# Patient Record
Sex: Female | Born: 1965 | Race: Black or African American | Hispanic: No | Marital: Married | Smoking: Never smoker
Health system: Southern US, Community
[De-identification: ages and names within clinical notes are randomized; demographics above are authoritative.]

## PROBLEM LIST (undated history)

## (undated) DIAGNOSIS — E785 Hyperlipidemia, unspecified: Secondary | ICD-10-CM

## (undated) HISTORY — DX: Hyperlipidemia, unspecified: E78.5

## (undated) HISTORY — PX: ABDOMINAL HYSTERECTOMY: SHX81

---

## 2001-04-23 ENCOUNTER — Encounter: Admission: RE | Admit: 2001-04-23 | Discharge: 2001-04-23 | Payer: Self-pay | Admitting: Orthopedic Surgery

## 2001-04-23 ENCOUNTER — Encounter: Payer: Self-pay | Admitting: Orthopedic Surgery

## 2001-08-26 ENCOUNTER — Encounter: Payer: Self-pay | Admitting: Orthopedic Surgery

## 2001-08-29 ENCOUNTER — Encounter: Payer: Self-pay | Admitting: Orthopedic Surgery

## 2001-08-29 ENCOUNTER — Inpatient Hospital Stay (HOSPITAL_COMMUNITY): Admission: RE | Admit: 2001-08-29 | Discharge: 2001-09-03 | Payer: Self-pay | Admitting: Orthopedic Surgery

## 2001-09-01 ENCOUNTER — Encounter: Payer: Self-pay | Admitting: Orthopedic Surgery

## 2001-09-02 ENCOUNTER — Encounter: Payer: Self-pay | Admitting: Orthopedic Surgery

## 2001-09-03 ENCOUNTER — Encounter: Payer: Self-pay | Admitting: Orthopedic Surgery

## 2002-05-05 ENCOUNTER — Encounter: Admission: RE | Admit: 2002-05-05 | Discharge: 2002-05-05 | Payer: Self-pay | Admitting: Orthopedic Surgery

## 2002-05-05 ENCOUNTER — Encounter: Payer: Self-pay | Admitting: Orthopedic Surgery

## 2002-11-11 ENCOUNTER — Encounter: Admission: RE | Admit: 2002-11-11 | Discharge: 2002-11-11 | Payer: Self-pay | Admitting: Orthopedic Surgery

## 2002-11-11 ENCOUNTER — Encounter: Payer: Self-pay | Admitting: Orthopedic Surgery

## 2003-05-05 ENCOUNTER — Encounter: Admission: RE | Admit: 2003-05-05 | Discharge: 2003-05-05 | Payer: Self-pay | Admitting: Orthopedic Surgery

## 2003-12-06 ENCOUNTER — Encounter: Admission: RE | Admit: 2003-12-06 | Discharge: 2003-12-06 | Payer: Self-pay | Admitting: Orthopedic Surgery

## 2005-05-17 ENCOUNTER — Ambulatory Visit: Payer: Self-pay | Admitting: Physical Medicine & Rehabilitation

## 2005-05-17 ENCOUNTER — Inpatient Hospital Stay (HOSPITAL_COMMUNITY): Admission: RE | Admit: 2005-05-17 | Discharge: 2005-05-22 | Payer: Self-pay | Admitting: Orthopedic Surgery

## 2005-05-29 ENCOUNTER — Encounter: Admission: RE | Admit: 2005-05-29 | Discharge: 2005-05-29 | Payer: Self-pay | Admitting: Orthopedic Surgery

## 2005-12-18 ENCOUNTER — Ambulatory Visit: Payer: Self-pay | Admitting: Family Medicine

## 2005-12-25 ENCOUNTER — Ambulatory Visit: Payer: Self-pay | Admitting: Gastroenterology

## 2007-11-07 ENCOUNTER — Ambulatory Visit: Payer: Self-pay

## 2008-07-07 ENCOUNTER — Ambulatory Visit: Payer: Self-pay | Admitting: Internal Medicine

## 2010-02-21 ENCOUNTER — Ambulatory Visit: Payer: Self-pay | Admitting: Family Medicine

## 2010-03-26 ENCOUNTER — Encounter: Payer: Self-pay | Admitting: Orthopedic Surgery

## 2011-04-24 ENCOUNTER — Ambulatory Visit: Payer: Self-pay

## 2012-05-02 ENCOUNTER — Ambulatory Visit: Payer: Self-pay

## 2013-07-28 ENCOUNTER — Ambulatory Visit: Payer: Self-pay

## 2014-12-30 DIAGNOSIS — Z96643 Presence of artificial hip joint, bilateral: Secondary | ICD-10-CM

## 2014-12-30 DIAGNOSIS — M1711 Unilateral primary osteoarthritis, right knee: Secondary | ICD-10-CM

## 2014-12-30 HISTORY — DX: Unilateral primary osteoarthritis, right knee: M17.11

## 2014-12-30 HISTORY — DX: Presence of artificial hip joint, bilateral: Z96.643

## 2014-12-31 ENCOUNTER — Other Ambulatory Visit: Payer: Self-pay | Admitting: Family Medicine

## 2014-12-31 DIAGNOSIS — Z1231 Encounter for screening mammogram for malignant neoplasm of breast: Secondary | ICD-10-CM

## 2015-01-06 ENCOUNTER — Ambulatory Visit
Admission: RE | Admit: 2015-01-06 | Discharge: 2015-01-06 | Disposition: A | Discharge status: Home / Self Care | Payer: BLUE CROSS/BLUE SHIELD | Source: Ambulatory Visit | Attending: Family Medicine | Admitting: Family Medicine

## 2015-01-06 DIAGNOSIS — Z1231 Encounter for screening mammogram for malignant neoplasm of breast: Secondary | ICD-10-CM | POA: Insufficient documentation

## 2016-02-02 ENCOUNTER — Other Ambulatory Visit: Payer: Self-pay | Admitting: Family Medicine

## 2016-02-02 DIAGNOSIS — Z1231 Encounter for screening mammogram for malignant neoplasm of breast: Secondary | ICD-10-CM

## 2016-02-21 ENCOUNTER — Ambulatory Visit: Admission: RE | Admit: 2016-02-21 | Payer: BLUE CROSS/BLUE SHIELD | Source: Ambulatory Visit

## 2016-03-30 ENCOUNTER — Ambulatory Visit: Payer: BLUE CROSS/BLUE SHIELD

## 2016-04-23 ENCOUNTER — Ambulatory Visit
Admission: RE | Admit: 2016-04-23 | Discharge: 2016-04-23 | Disposition: A | Discharge status: Home / Self Care | Payer: BLUE CROSS/BLUE SHIELD | Source: Ambulatory Visit | Attending: Family Medicine | Admitting: Family Medicine

## 2016-04-23 DIAGNOSIS — Z1231 Encounter for screening mammogram for malignant neoplasm of breast: Secondary | ICD-10-CM

## 2017-04-12 ENCOUNTER — Other Ambulatory Visit: Payer: Self-pay | Admitting: Family Medicine

## 2017-04-12 DIAGNOSIS — Z1231 Encounter for screening mammogram for malignant neoplasm of breast: Secondary | ICD-10-CM

## 2017-04-30 ENCOUNTER — Ambulatory Visit
Admission: RE | Admit: 2017-04-30 | Discharge: 2017-04-30 | Disposition: A | Discharge status: Home / Self Care | Payer: Managed Care, Other (non HMO) | Source: Ambulatory Visit | Attending: Family Medicine | Admitting: Family Medicine

## 2017-04-30 DIAGNOSIS — Z1231 Encounter for screening mammogram for malignant neoplasm of breast: Secondary | ICD-10-CM

## 2017-07-02 ENCOUNTER — Encounter (INDEPENDENT_AMBULATORY_CARE_PROVIDER_SITE_OTHER): Payer: Self-pay

## 2017-07-02 ENCOUNTER — Other Ambulatory Visit: Payer: Self-pay | Admitting: Family Medicine

## 2017-07-02 ENCOUNTER — Ambulatory Visit
Admission: RE | Admit: 2017-07-02 | Discharge: 2017-07-02 | Disposition: A | Discharge status: Home / Self Care | Payer: Managed Care, Other (non HMO) | Source: Ambulatory Visit | Attending: Family Medicine | Admitting: Family Medicine

## 2017-07-02 DIAGNOSIS — R600 Localized edema: Secondary | ICD-10-CM | POA: Diagnosis not present

## 2017-07-02 DIAGNOSIS — M79605 Pain in left leg: Principal | ICD-10-CM

## 2017-07-02 DIAGNOSIS — M79604 Pain in right leg: Secondary | ICD-10-CM

## 2018-04-28 ENCOUNTER — Other Ambulatory Visit: Payer: Self-pay | Admitting: Family Medicine

## 2018-04-28 DIAGNOSIS — Z780 Asymptomatic menopausal state: Secondary | ICD-10-CM

## 2018-05-01 ENCOUNTER — Other Ambulatory Visit: Payer: Self-pay | Admitting: Family Medicine

## 2018-05-01 DIAGNOSIS — Z1231 Encounter for screening mammogram for malignant neoplasm of breast: Secondary | ICD-10-CM

## 2018-05-05 ENCOUNTER — Ambulatory Visit: Payer: Managed Care, Other (non HMO)

## 2018-05-15 ENCOUNTER — Ambulatory Visit: Payer: Managed Care, Other (non HMO)

## 2018-05-21 ENCOUNTER — Encounter (INDEPENDENT_AMBULATORY_CARE_PROVIDER_SITE_OTHER): Payer: Self-pay

## 2018-05-21 ENCOUNTER — Ambulatory Visit
Admission: RE | Admit: 2018-05-21 | Discharge: 2018-05-21 | Disposition: A | Discharge status: Home / Self Care | Payer: Managed Care, Other (non HMO) | Source: Ambulatory Visit | Attending: Family Medicine | Admitting: Family Medicine

## 2018-05-21 ENCOUNTER — Other Ambulatory Visit: Payer: Self-pay

## 2018-05-21 DIAGNOSIS — Z1231 Encounter for screening mammogram for malignant neoplasm of breast: Secondary | ICD-10-CM | POA: Insufficient documentation

## 2019-06-04 ENCOUNTER — Other Ambulatory Visit: Payer: Self-pay | Admitting: Family Medicine

## 2019-06-04 DIAGNOSIS — Z1231 Encounter for screening mammogram for malignant neoplasm of breast: Secondary | ICD-10-CM

## 2019-06-08 ENCOUNTER — Ambulatory Visit
Admission: RE | Admit: 2019-06-08 | Discharge: 2019-06-08 | Disposition: A | Discharge status: Home / Self Care | Payer: Managed Care, Other (non HMO) | Source: Ambulatory Visit | Attending: Family Medicine | Admitting: Family Medicine

## 2019-06-08 DIAGNOSIS — Z1231 Encounter for screening mammogram for malignant neoplasm of breast: Secondary | ICD-10-CM | POA: Insufficient documentation

## 2019-12-09 ENCOUNTER — Telehealth: Payer: Self-pay | Admitting: Licensed Clinical Social Worker

## 2019-12-09 NOTE — Telephone Encounter (Signed)
Patient left vm for LCSW regarding interest in mental health/behavior health services.

## 2020-05-11 ENCOUNTER — Other Ambulatory Visit: Payer: Self-pay | Admitting: Family Medicine

## 2020-05-11 DIAGNOSIS — Z1231 Encounter for screening mammogram for malignant neoplasm of breast: Secondary | ICD-10-CM

## 2020-06-04 DIAGNOSIS — G8929 Other chronic pain: Secondary | ICD-10-CM | POA: Insufficient documentation

## 2020-06-04 HISTORY — DX: Other chronic pain: G89.29

## 2020-06-04 HISTORY — DX: Morbid (severe) obesity due to excess calories: E66.01

## 2020-06-08 ENCOUNTER — Other Ambulatory Visit: Payer: Self-pay

## 2020-06-08 ENCOUNTER — Ambulatory Visit
Admission: RE | Admit: 2020-06-08 | Discharge: 2020-06-08 | Disposition: A | Discharge status: Home / Self Care | Payer: 59 | Source: Ambulatory Visit | Attending: Family Medicine | Admitting: Family Medicine

## 2020-06-08 DIAGNOSIS — Z1231 Encounter for screening mammogram for malignant neoplasm of breast: Secondary | ICD-10-CM

## 2021-05-01 ENCOUNTER — Other Ambulatory Visit: Payer: Self-pay | Admitting: Family Medicine

## 2021-05-01 DIAGNOSIS — Z1231 Encounter for screening mammogram for malignant neoplasm of breast: Secondary | ICD-10-CM

## 2021-06-23 LAB — TSH: TSH: 2.92 (ref 0.41–5.90)

## 2021-07-19 ENCOUNTER — Ambulatory Visit
Admission: RE | Admit: 2021-07-19 | Discharge: 2021-07-19 | Disposition: A | Discharge status: Home / Self Care | Payer: 59 | Source: Ambulatory Visit | Attending: Family Medicine | Admitting: Family Medicine

## 2021-07-19 DIAGNOSIS — Z1231 Encounter for screening mammogram for malignant neoplasm of breast: Secondary | ICD-10-CM | POA: Insufficient documentation

## 2021-09-22 LAB — VITAMIN D 25 HYDROXY (VIT D DEFICIENCY, FRACTURES): Vit D, 25-Hydroxy: 34.9

## 2021-09-22 LAB — VITAMIN B12: Vitamin B-12: 774

## 2022-01-05 LAB — BASIC METABOLIC PANEL
BUN: 17 (ref 4–21)
CO2: 22 (ref 13–22)
Chloride: 106 (ref 99–108)
Creatinine: 0.9 (ref 0.5–1.1)
Glucose: 88
Potassium: 4.1 mEq/L (ref 3.5–5.1)
Sodium: 141 (ref 137–147)

## 2022-01-05 LAB — CBC AND DIFFERENTIAL
HCT: 41 (ref 36–46)
Hemoglobin: 13.6 (ref 12.0–16.0)
Neutrophils Absolute: 4.7
Platelets: 175 10*3/uL (ref 150–400)
WBC: 6.9

## 2022-01-05 LAB — HEPATIC FUNCTION PANEL
ALT: 9 U/L (ref 7–35)
AST: 20 (ref 13–35)
Alkaline Phosphatase: 70 (ref 25–125)
Bilirubin, Direct: 0.1

## 2022-01-05 LAB — COMPREHENSIVE METABOLIC PANEL
Albumin: 4 (ref 3.5–5.0)
Calcium: 9 (ref 8.7–10.7)
Globulin: 2.2
eGFR: 72

## 2022-01-05 LAB — LIPID PANEL
Cholesterol: 151 (ref 0–200)
HDL: 64 (ref 35–70)
LDL Cholesterol: 77
Triglycerides: 48 (ref 40–160)

## 2022-01-05 LAB — HEMOGLOBIN A1C: Hemoglobin A1C: 5.4

## 2022-01-05 LAB — CBC: RBC: 4.54 (ref 3.87–5.11)

## 2022-04-11 ENCOUNTER — Encounter: Payer: Self-pay | Admitting: Family

## 2022-04-11 DIAGNOSIS — I10 Essential (primary) hypertension: Secondary | ICD-10-CM

## 2022-04-11 DIAGNOSIS — E559 Vitamin D deficiency, unspecified: Secondary | ICD-10-CM | POA: Insufficient documentation

## 2022-04-11 HISTORY — DX: Vitamin D deficiency, unspecified: E55.9

## 2022-04-11 HISTORY — DX: Essential (primary) hypertension: I10

## 2022-04-12 ENCOUNTER — Encounter: Payer: Self-pay | Admitting: Family

## 2022-04-12 ENCOUNTER — Ambulatory Visit (INDEPENDENT_AMBULATORY_CARE_PROVIDER_SITE_OTHER): Payer: 59 | Admitting: Family

## 2022-04-12 VITALS — BP 112/80 | HR 88 | Ht 66.0 in | Wt 261.2 lb

## 2022-04-12 DIAGNOSIS — Z6841 Body Mass Index (BMI) 40.0 and over, adult: Secondary | ICD-10-CM | POA: Diagnosis not present

## 2022-04-12 DIAGNOSIS — E782 Mixed hyperlipidemia: Secondary | ICD-10-CM | POA: Diagnosis not present

## 2022-04-12 DIAGNOSIS — R7303 Prediabetes: Secondary | ICD-10-CM | POA: Diagnosis not present

## 2022-04-12 DIAGNOSIS — M25579 Pain in unspecified ankle and joints of unspecified foot: Secondary | ICD-10-CM

## 2022-04-12 DIAGNOSIS — M1712 Unilateral primary osteoarthritis, left knee: Secondary | ICD-10-CM

## 2022-04-12 DIAGNOSIS — M1711 Unilateral primary osteoarthritis, right knee: Secondary | ICD-10-CM | POA: Diagnosis not present

## 2022-04-12 DIAGNOSIS — Z1159 Encounter for screening for other viral diseases: Secondary | ICD-10-CM

## 2022-04-12 DIAGNOSIS — E559 Vitamin D deficiency, unspecified: Secondary | ICD-10-CM | POA: Diagnosis not present

## 2022-04-12 DIAGNOSIS — I10 Essential (primary) hypertension: Secondary | ICD-10-CM

## 2022-04-12 DIAGNOSIS — Z113 Encounter for screening for infections with a predominantly sexual mode of transmission: Secondary | ICD-10-CM

## 2022-04-12 HISTORY — DX: Unilateral primary osteoarthritis, left knee: M17.12

## 2022-04-12 NOTE — Assessment & Plan Note (Signed)
Continue meloxicam.  Sending referral for weight loss.   
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
## 2022-04-12 NOTE — Patient Instructions (Addendum)

## 2022-04-12 NOTE — Assessment & Plan Note (Signed)
Sending Referral to Weight Management for her.  Will see if we can get a different weight loss medication covered by her insurance in the meantime.   Patient provided with low fat diet and calorie counting instructions.

## 2022-04-12 NOTE — Assessment & Plan Note (Signed)
Continue meloxicam.  Sending referral for weight loss.

## 2022-04-12 NOTE — Assessment & Plan Note (Signed)
Continue current meds - will send refills as needed.  Checking labs today.

## 2022-04-12 NOTE — Assessment & Plan Note (Signed)
Checking level today  Will let her know if she needs to continue the Vitamin D supplement.

## 2022-04-13 ENCOUNTER — Encounter: Payer: Self-pay | Admitting: Family

## 2022-04-13 LAB — CBC WITH DIFFERENTIAL
Basophils Absolute: 0 10*3/uL (ref 0.0–0.2)
Basos: 0 %
EOS (ABSOLUTE): 0.2 10*3/uL (ref 0.0–0.4)
Eos: 2 %
Hematocrit: 41.4 % (ref 34.0–46.6)
Hemoglobin: 13.8 g/dL (ref 11.1–15.9)
Immature Grans (Abs): 0 10*3/uL (ref 0.0–0.1)
Immature Granulocytes: 0 %
Lymphocytes Absolute: 1.7 10*3/uL (ref 0.7–3.1)
Lymphs: 27 %
MCH: 30.4 pg (ref 26.6–33.0)
MCHC: 33.3 g/dL (ref 31.5–35.7)
MCV: 91 fL (ref 79–97)
Monocytes Absolute: 0.4 10*3/uL (ref 0.1–0.9)
Monocytes: 6 %
Neutrophils Absolute: 4 10*3/uL (ref 1.4–7.0)
Neutrophils: 65 %
RBC: 4.54 x10E6/uL (ref 3.77–5.28)
RDW: 12.2 % (ref 11.7–15.4)
WBC: 6.2 10*3/uL (ref 3.4–10.8)

## 2022-04-13 LAB — CMP14+EGFR
ALT: 13 IU/L (ref 0–32)
AST: 16 IU/L (ref 0–40)
Albumin/Globulin Ratio: 1.8 (ref 1.2–2.2)
Albumin: 4 g/dL (ref 3.8–4.9)
Alkaline Phosphatase: 79 IU/L (ref 44–121)
BUN/Creatinine Ratio: 27 — ABNORMAL HIGH (ref 9–23)
BUN: 20 mg/dL (ref 6–24)
Bilirubin Total: <0.2 mg/dL (ref 0.0–1.2)
CO2: 22 mmol/L (ref 20–29)
Calcium: 9.1 mg/dL (ref 8.7–10.2)
Chloride: 105 mmol/L (ref 96–106)
Creatinine, Ser: 0.73 mg/dL (ref 0.57–1.00)
Globulin, Total: 2.2 g/dL (ref 1.5–4.5)
Glucose: 82 mg/dL (ref 70–99)
Potassium: 3.9 mmol/L (ref 3.5–5.2)
Sodium: 140 mmol/L (ref 134–144)
Total Protein: 6.2 g/dL (ref 6.0–8.5)
eGFR: 96 mL/min/{1.73_m2} (ref >59)

## 2022-04-13 LAB — LIPID PANEL
Chol/HDL Ratio: 2.4 ratio (ref 0.0–4.4)
Cholesterol, Total: 165 mg/dL (ref 100–199)
HDL: 70 mg/dL (ref >39)
LDL Chol Calc (NIH): 86 mg/dL (ref 0–99)
Triglycerides: 43 mg/dL (ref 0–149)
VLDL Cholesterol Cal: 9 mg/dL (ref 5–40)

## 2022-04-13 LAB — TSH: TSH: 2.74 u[IU]/mL (ref 0.450–4.500)

## 2022-04-13 LAB — HEMOGLOBIN A1C
Est. average glucose Bld gHb Est-mCnc: 108 mg/dL
Hgb A1c MFr Bld: 5.4 % (ref 4.8–5.6)

## 2022-04-13 LAB — VITAMIN D 25 HYDROXY (VIT D DEFICIENCY, FRACTURES): Vit D, 25-Hydroxy: 37.3 ng/mL (ref 30.0–100.0)

## 2022-04-13 LAB — HIV ANTIBODY (ROUTINE TESTING W REFLEX): HIV Screen 4th Generation wRfx: NONREACTIVE

## 2022-04-17 LAB — VIRAL HEPATITIS HBV, HCV
HCV Ab: NONREACTIVE
Hep B Core Total Ab: NEGATIVE
Hep B Surface Ab, Qual: NONREACTIVE
Hepatitis B Surface Ag: NEGATIVE

## 2022-04-17 LAB — HCV INTERPRETATION

## 2022-04-17 LAB — SPECIMEN STATUS REPORT

## 2022-06-01 ENCOUNTER — Encounter: Payer: 59 | Attending: Family | Admitting: Dietician

## 2022-06-01 ENCOUNTER — Encounter: Payer: Self-pay | Admitting: Dietician

## 2022-06-01 DIAGNOSIS — E559 Vitamin D deficiency, unspecified: Secondary | ICD-10-CM | POA: Diagnosis not present

## 2022-06-01 DIAGNOSIS — M25569 Pain in unspecified knee: Secondary | ICD-10-CM | POA: Diagnosis not present

## 2022-06-01 DIAGNOSIS — Z6841 Body Mass Index (BMI) 40.0 and over, adult: Secondary | ICD-10-CM | POA: Diagnosis not present

## 2022-06-01 DIAGNOSIS — R7303 Prediabetes: Secondary | ICD-10-CM | POA: Insufficient documentation

## 2022-06-01 DIAGNOSIS — I10 Essential (primary) hypertension: Secondary | ICD-10-CM | POA: Insufficient documentation

## 2022-06-01 DIAGNOSIS — E669 Obesity, unspecified: Secondary | ICD-10-CM | POA: Diagnosis not present

## 2022-06-01 DIAGNOSIS — G8929 Other chronic pain: Secondary | ICD-10-CM

## 2022-06-01 DIAGNOSIS — Z713 Dietary counseling and surveillance: Secondary | ICD-10-CM | POA: Diagnosis not present

## 2022-06-01 NOTE — Patient Instructions (Signed)
Plan to eat a meal or snack every 4-5 hours during the day. Protein shake is ok once a day to replace a meal if not hungry.  Combine a protein food with at least one other food group (starch, fruit, or vegetable) when eating.  Keep food choices low in fat and sugar to ensure calories are controlled.
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
## 2022-07-13 ENCOUNTER — Ambulatory Visit: Payer: 59 | Admitting: Family

## 2022-07-27 ENCOUNTER — Ambulatory Visit (INDEPENDENT_AMBULATORY_CARE_PROVIDER_SITE_OTHER): Payer: 59 | Admitting: Family

## 2022-07-27 ENCOUNTER — Encounter: Payer: Self-pay | Admitting: Family

## 2022-07-27 VITALS — BP 130/70 | HR 88 | Ht 66.0 in | Wt 266.4 lb

## 2022-07-27 DIAGNOSIS — E538 Deficiency of other specified B group vitamins: Secondary | ICD-10-CM | POA: Diagnosis not present

## 2022-07-27 DIAGNOSIS — E782 Mixed hyperlipidemia: Secondary | ICD-10-CM | POA: Diagnosis not present

## 2022-07-27 DIAGNOSIS — E559 Vitamin D deficiency, unspecified: Secondary | ICD-10-CM

## 2022-07-27 DIAGNOSIS — R7303 Prediabetes: Secondary | ICD-10-CM | POA: Diagnosis not present

## 2022-07-27 DIAGNOSIS — I10 Essential (primary) hypertension: Secondary | ICD-10-CM

## 2022-07-27 DIAGNOSIS — D508 Other iron deficiency anemias: Secondary | ICD-10-CM

## 2022-07-27 DIAGNOSIS — R5383 Other fatigue: Secondary | ICD-10-CM
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
## 2022-07-28 LAB — CBC WITH DIFFERENTIAL
Basophils Absolute: 0.1 10*3/uL (ref 0.0–0.2)
Basos: 1 %
EOS (ABSOLUTE): 0.2 10*3/uL (ref 0.0–0.4)
Eos: 2 %
Hematocrit: 48.7 % — ABNORMAL HIGH (ref 34.0–46.6)
Hemoglobin: 15.7 g/dL (ref 11.1–15.9)
Immature Grans (Abs): 0 10*3/uL (ref 0.0–0.1)
Immature Granulocytes: 0 %
Lymphocytes Absolute: 2.2 10*3/uL (ref 0.7–3.1)
Lymphs: 23 %
MCH: 29.6 pg (ref 26.6–33.0)
MCHC: 32.2 g/dL (ref 31.5–35.7)
MCV: 92 fL (ref 79–97)
Monocytes Absolute: 0.4 10*3/uL (ref 0.1–0.9)
Monocytes: 5 %
Neutrophils Absolute: 6.7 10*3/uL (ref 1.4–7.0)
Neutrophils: 69 %
RBC: 5.3 x10E6/uL — ABNORMAL HIGH (ref 3.77–5.28)
RDW: 12.3 % (ref 11.7–15.4)
WBC: 9.5 10*3/uL (ref 3.4–10.8)

## 2022-07-28 LAB — CMP14+EGFR
ALT: 12 IU/L (ref 0–32)
AST: 19 IU/L (ref 0–40)
Albumin/Globulin Ratio: 2.4 — ABNORMAL HIGH (ref 1.2–2.2)
Albumin: 3.6 g/dL — ABNORMAL LOW (ref 3.8–4.9)
Alkaline Phosphatase: 57 IU/L (ref 44–121)
BUN/Creatinine Ratio: 22 (ref 9–23)
BUN: 15 mg/dL (ref 6–24)
Bilirubin Total: <0.2 mg/dL (ref 0.0–1.2)
CO2: 19 mmol/L — ABNORMAL LOW (ref 20–29)
Calcium: 8.6 mg/dL — ABNORMAL LOW (ref 8.7–10.2)
Chloride: 107 mmol/L — ABNORMAL HIGH (ref 96–106)
Creatinine, Ser: 0.69 mg/dL (ref 0.57–1.00)
Globulin, Total: 1.5 g/dL (ref 1.5–4.5)
Glucose: 68 mg/dL — ABNORMAL LOW (ref 70–99)
Potassium: 3.8 mmol/L (ref 3.5–5.2)
Sodium: 142 mmol/L (ref 134–144)
Total Protein: 5.1 g/dL — ABNORMAL LOW (ref 6.0–8.5)
eGFR: 102 mL/min/{1.73_m2} (ref >59)

## 2022-07-28 LAB — IRON,TIBC AND FERRITIN PANEL
Ferritin: 28 ng/mL (ref 15–150)
Iron Saturation: 27 % (ref 15–55)
Iron: 91 ug/dL (ref 27–159)
Total Iron Binding Capacity: 333 ug/dL (ref 250–450)
UIBC: 242 ug/dL (ref 131–425)

## 2022-07-28 LAB — LIPID PANEL
Chol/HDL Ratio: 2.5 ratio (ref 0.0–4.4)
Cholesterol, Total: 145 mg/dL (ref 100–199)
HDL: 59 mg/dL (ref >39)
LDL Chol Calc (NIH): 73 mg/dL (ref 0–99)
Triglycerides: 64 mg/dL (ref 0–149)
VLDL Cholesterol Cal: 13 mg/dL (ref 5–40)

## 2022-07-28 LAB — VITAMIN B12: Vitamin B-12: 845 pg/mL (ref 232–1245)

## 2022-07-28 LAB — TSH: TSH: 3.42 u[IU]/mL (ref 0.450–4.500)

## 2022-07-28 LAB — HEMOGLOBIN A1C
Est. average glucose Bld gHb Est-mCnc: 111 mg/dL
Hgb A1c MFr Bld: 5.5 % (ref 4.8–5.6)

## 2022-07-28 LAB — VITAMIN D 25 HYDROXY (VIT D DEFICIENCY, FRACTURES): Vit D, 25-Hydroxy: 37.5 ng/mL (ref 30.0–100.0)

## 2022-07-29 ENCOUNTER — Encounter: Payer: Self-pay | Admitting: Family

## 2022-07-31 ENCOUNTER — Other Ambulatory Visit: Payer: Self-pay | Admitting: Family

## 2022-07-31 ENCOUNTER — Other Ambulatory Visit: Payer: Self-pay

## 2022-07-31 MED ORDER — HYDROCHLOROTHIAZIDE 12.5 MG PO TABS
12.5000 mg | ORAL_TABLET | Freq: Every day | ORAL | 1 refills | Status: DC
  Filled 2022-07-31: qty 30, 30d supply, fill #0

## 2022-07-31 MED ORDER — BUPROPION HCL ER (SR) 150 MG PO TB12: 150.0000 mg | ORAL_TABLET | Freq: Two times a day (BID) | ORAL | 1 refills | Status: DC

## 2022-08-02 ENCOUNTER — Other Ambulatory Visit: Payer: Self-pay

## 2022-08-06 ENCOUNTER — Other Ambulatory Visit: Payer: Self-pay

## 2022-08-06 ENCOUNTER — Other Ambulatory Visit: Payer: Self-pay | Admitting: Family

## 2022-08-06 MED ORDER — MELOXICAM 15 MG PO TABS
15.0000 mg | ORAL_TABLET | Freq: Every day | ORAL | 1 refills | Status: DC
  Filled 2022-08-06: qty 90, 90d supply, fill #0

## 2022-08-08 ENCOUNTER — Telehealth: Payer: Self-pay | Admitting: Family

## 2022-08-08 MED ORDER — FLUTICASONE PROPIONATE 50 MCG/ACT NA SUSP: 1.0000 | Freq: Every day | NASAL | 2 refills | Status: DC

## 2022-08-08 NOTE — Telephone Encounter (Signed)
Patient called in requesting something for allergies and drainage sent in. States she called and left VM Monday but has not heard back.  [facility] [address]

## 2022-08-09 ENCOUNTER — Ambulatory Visit: Payer: 59 | Admitting: Dietician

## 2022-08-24 ENCOUNTER — Ambulatory Visit: Payer: 59 | Admitting: Dietician

## 2022-09-07 ENCOUNTER — Other Ambulatory Visit: Payer: Self-pay | Admitting: Family

## 2022-09-14 ENCOUNTER — Other Ambulatory Visit: Payer: Self-pay | Admitting: Family

## 2022-09-14 ENCOUNTER — Encounter: Payer: Self-pay | Admitting: Family Medicine

## 2022-09-14 DIAGNOSIS — Z1231 Encounter for screening mammogram for malignant neoplasm of breast: Secondary | ICD-10-CM

## 2022-09-20 ENCOUNTER — Telehealth: Payer: Self-pay | Admitting: Family

## 2022-09-20 NOTE — Telephone Encounter (Signed)
Patient called in requesting something stronger for pain, states the meloxicam is not helping. She is having knee pain and not getting relief.  [facility] [address]

## 2022-10-04 ENCOUNTER — Ambulatory Visit
Admission: RE | Admit: 2022-10-04 | Discharge: 2022-10-04 | Disposition: A | Discharge status: Home / Self Care | Payer: 59 | Source: Ambulatory Visit | Attending: Family | Admitting: Family

## 2022-10-04 DIAGNOSIS — Z1231 Encounter for screening mammogram for malignant neoplasm of breast: Secondary | ICD-10-CM | POA: Diagnosis present

## 2022-10-12 ENCOUNTER — Other Ambulatory Visit: Payer: Self-pay

## 2022-10-12 MED ORDER — CELECOXIB 200 MG PO CAPS: 200.0000 mg | ORAL_CAPSULE | Freq: Two times a day (BID) | ORAL | 0 refills | Status: DC

## 2022-11-02 ENCOUNTER — Ambulatory Visit: Payer: 59 | Admitting: Family

## 2022-11-16 ENCOUNTER — Ambulatory Visit (INDEPENDENT_AMBULATORY_CARE_PROVIDER_SITE_OTHER): Payer: 59 | Admitting: Family

## 2022-11-16 ENCOUNTER — Encounter: Payer: Self-pay | Admitting: Family

## 2022-11-16 VITALS — BP 130/99 | HR 66 | Ht 66.0 in | Wt 266.4 lb

## 2022-11-16 DIAGNOSIS — R7303 Prediabetes: Secondary | ICD-10-CM

## 2022-11-16 DIAGNOSIS — E782 Mixed hyperlipidemia: Secondary | ICD-10-CM | POA: Diagnosis not present

## 2022-11-16 DIAGNOSIS — I10 Essential (primary) hypertension: Secondary | ICD-10-CM

## 2022-11-16 DIAGNOSIS — R5383 Other fatigue: Secondary | ICD-10-CM

## 2022-11-16 DIAGNOSIS — E559 Vitamin D deficiency, unspecified: Secondary | ICD-10-CM | POA: Diagnosis not present

## 2022-11-16 DIAGNOSIS — E538 Deficiency of other specified B group vitamins: Secondary | ICD-10-CM

## 2022-11-16 MED ORDER — SEMAGLUTIDE-WEIGHT MANAGEMENT 1 MG/0.5ML ~~LOC~~ SOAJ: 1.0000 mg | SUBCUTANEOUS | 0 refills | Status: AC

## 2022-11-16 MED ORDER — SEMAGLUTIDE-WEIGHT MANAGEMENT 0.25 MG/0.5ML ~~LOC~~ SOAJ: 0.2500 mg | SUBCUTANEOUS | 0 refills | Status: DC

## 2022-11-16 MED ORDER — HYDROCHLOROTHIAZIDE 12.5 MG PO TABS: 12.5000 mg | ORAL_TABLET | Freq: Every day | ORAL | 1 refills | Status: DC

## 2022-11-16 MED ORDER — NYSTATIN 100000 UNIT/GM EX OINT: 1.0000 | TOPICAL_OINTMENT | Freq: Two times a day (BID) | CUTANEOUS | 0 refills | Status: AC

## 2022-11-16 MED ORDER — SEMAGLUTIDE-WEIGHT MANAGEMENT 1.7 MG/0.75ML ~~LOC~~ SOAJ: 1.7000 mg | SUBCUTANEOUS | 0 refills | Status: AC

## 2022-11-16 MED ORDER — FLUTICASONE PROPIONATE 50 MCG/ACT NA SUSP: 1.0000 | Freq: Two times a day (BID) | NASAL | 3 refills | Status: DC

## 2022-11-16 MED ORDER — SEMAGLUTIDE-WEIGHT MANAGEMENT 2.4 MG/0.75ML ~~LOC~~ SOAJ: 2.4000 mg | SUBCUTANEOUS | 1 refills | Status: DC

## 2022-11-16 MED ORDER — CELECOXIB 200 MG PO CAPS: 200.0000 mg | ORAL_CAPSULE | Freq: Two times a day (BID) | ORAL | 1 refills | Status: AC

## 2022-11-16 MED ORDER — BUPROPION HCL ER (SR) 150 MG PO TB12: 150.0000 mg | ORAL_TABLET | Freq: Two times a day (BID) | ORAL | 1 refills | Status: DC

## 2022-11-16 MED ORDER — LEVOCETIRIZINE DIHYDROCHLORIDE 5 MG PO TABS: 5.0000 mg | ORAL_TABLET | Freq: Every evening | ORAL | 1 refills | Status: DC

## 2022-11-16 MED ORDER — SEMAGLUTIDE-WEIGHT MANAGEMENT 0.5 MG/0.5ML ~~LOC~~ SOAJ: 0.5000 mg | SUBCUTANEOUS | 0 refills | Status: AC

## 2022-11-16 NOTE — Progress Notes (Signed)
Established Patient Office Visit  Subjective:  Patient ID: Julia Valenzuela, female    DOB: 1965-11-10  Age: 57 y.o. MRN: [id number]  Chief Complaint  Patient presents with   Follow-up    3 MO F/U    Patient is here today for her 3 months follow up.  She has been feeling fairly well since last appointment.   She does have additional concerns to discuss today.  Cough a lot  Has been losing control of her bladder as a result.   Redness under one side of her stomach, says it comes and goes, and will start itching.   Labs are due today. She needs refills.   I have reviewed her active problem list, medication list, allergies, notes from last encounter, lab results for her appointment today.      No other concerns at this time.   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   Vitamin D deficiency, unspecified 04/11/2022    Past Surgical History:  Procedure Laterality Date   ABDOMINAL HYSTERECTOMY      Social History   Socioeconomic History   Marital status: Married    Spouse name: Not on file   Number of children: Not on file   Years of education: Not on file   Highest education level: Not on file  Occupational History   Not on file  Tobacco Use   Smoking status: Never   Smokeless tobacco: Never  Vaping Use   Vaping status: Never Used  Substance and Sexual Activity   Alcohol use: Never   Drug use: Never   Sexual activity: Not Currently  Other Topics Concern   Not on file  Social History Narrative   Not on file   Social Determinants of Health   Financial Resource Strain: Not on file  Food Insecurity: Not on file  Transportation Needs: Not on file  Physical Activity: Not on file   Stress: Not on file  Social Connections: Not on file  Intimate Partner Violence: Not on file    Family History  Problem Relation Age of Onset   Breast cancer Sister 10   Breast cancer Maternal Aunt        mat great aunt    Allergies  Allergen Reactions   Oxycodone-Acetaminophen Itching, Rash and Hives    Review of Systems  All other systems reviewed and are negative.      Objective:   BP (!) 130/99   Pulse 66   Ht 5\' 6"  (1.676 m)   Wt 266 lb 6.4 oz (120.8 kg)   SpO2 96%   BMI 43.00 kg/m   Vitals:   11/16/22 1055  BP: (!) 130/99  Pulse: 66  Height: 5\' 6"  (1.676 m)  Weight: 266 lb 6.4 oz (120.8 kg)  SpO2: 96%  BMI (Calculated): 43.02    Physical Exam Vitals and nursing note reviewed.  Constitutional:      Appearance: Normal appearance. She is normal weight.  HENT:     Head: Normocephalic.  Eyes:     Extraocular Movements: Extraocular movements intact.     Conjunctiva/sclera: Conjunctivae normal.     Pupils: Pupils are equal, round, and reactive to light.  Cardiovascular:  Rate and Rhythm: Normal rate.  Pulmonary:     Effort: Pulmonary effort is normal.  Neurological:     General: No focal deficit present.     Mental Status: She is alert and oriented to person, place, and time. Mental status is at baseline.  Psychiatric:        Mood and Affect: Mood normal.        Behavior: Behavior normal.        Thought Content: Thought content normal.        Judgment: Judgment normal.      Results for orders placed or performed in visit on 11/16/22  Lipid panel  Result Value Ref Range   Cholesterol, Total 179 100 - 199 mg/dL   Triglycerides 44 0 - 149 mg/dL   HDL 81 >96 mg/dL   VLDL Cholesterol Cal 9 5 - 40 mg/dL   LDL Chol Calc (NIH) 89 0 - 99 mg/dL   Chol/HDL Ratio 2.2 0.0 - 4.4 ratio  VITAMIN D 25 Hydroxy (Vit-D Deficiency, Fractures)  Result Value Ref Range   Vit D, 25-Hydroxy 42.9 30.0 - 100.0 ng/mL  CMP14+EGFR  Result Value Ref Range    Glucose 119 (H) 70 - 99 mg/dL   BUN 15 6 - 24 mg/dL   Creatinine, Ser 0.45 0.57 - 1.00 mg/dL   eGFR 96 >40 JW/JXB/1.47   BUN/Creatinine Ratio 21 9 - 23   Sodium 140 134 - 144 mmol/L   Potassium 3.9 3.5 - 5.2 mmol/L   Chloride 105 96 - 106 mmol/L   CO2 23 20 - 29 mmol/L   Calcium 9.2 8.7 - 10.2 mg/dL   Total Protein 6.1 6.0 - 8.5 g/dL   Albumin 4.2 3.8 - 4.9 g/dL   Globulin, Total 1.9 1.5 - 4.5 g/dL   Bilirubin Total 0.3 0.0 - 1.2 mg/dL   Alkaline Phosphatase 77 44 - 121 IU/L   AST 18 0 - 40 IU/L   ALT 14 0 - 32 IU/L  TSH  Result Value Ref Range   TSH 2.520 0.450 - 4.500 uIU/mL  Hemoglobin A1c  Result Value Ref Range   Hgb A1c MFr Bld 5.5 4.8 - 5.6 %   Est. average glucose Bld gHb Est-mCnc 111 mg/dL  Vitamin W29  Result Value Ref Range   Vitamin B-12 655 232 - 1,245 pg/mL  Iron, TIBC and Ferritin Panel  Result Value Ref Range   Total Iron Binding Capacity 349 250 - 450 ug/dL   UIBC 562 130 - 865 ug/dL   Iron 77 27 - 784 ug/dL   Iron Saturation 22 15 - 55 %   Ferritin 31 15 - 150 ng/mL    Recent Results (from the past 2160 hour(s))  Lipid panel     Status: None   Collection Time: 11/16/22 11:19 AM  Result Value Ref Range   Cholesterol, Total 179 100 - 199 mg/dL   Triglycerides 44 0 - 149 mg/dL   HDL 81 >69 mg/dL   VLDL Cholesterol Cal 9 5 - 40 mg/dL   LDL Chol Calc (NIH) 89 0 - 99 mg/dL   Chol/HDL Ratio 2.2 0.0 - 4.4 ratio    Comment:                                   T. Chol/HDL Ratio  Men  Women                               1/2 Avg.Risk  3.4    3.3                                   Avg.Risk  5.0    4.4                                2X Avg.Risk  9.6    7.1                                3X Avg.Risk 23.4   11.0   VITAMIN D 25 Hydroxy (Vit-D Deficiency, Fractures)     Status: None   Collection Time: 11/16/22 11:19 AM  Result Value Ref Range   Vit D, 25-Hydroxy 42.9 30.0 - 100.0 ng/mL    Comment: Vitamin D deficiency  has been defined by the Institute of Medicine and an Endocrine Society practice guideline as a level of serum 25-OH vitamin D less than 20 ng/mL (1,2). The Endocrine Society went on to further define vitamin D insufficiency as a level between 21 and 29 ng/mL (2). 1. IOM (Institute of Medicine). 2010. Dietary reference    intakes for calcium and D. [place]: The    [employer]. 2. Holick MF, Binkley [place], Bischoff-Ferrari HA, et al.    Evaluation, treatment, and prevention of vitamin D    deficiency: an Endocrine Society clinical practice    guideline. JCEM. 2011 Jul; 96(7):1911-30.   CMP14+EGFR     Status: Abnormal   Collection Time: 11/16/22 11:19 AM  Result Value Ref Range   Glucose 119 (H) 70 - 99 mg/dL   BUN 15 6 - 24 mg/dL   Creatinine, Ser 6.96 0.57 - 1.00 mg/dL   eGFR 96 >29 BM/WUX/3.24   BUN/Creatinine Ratio 21 9 - 23   Sodium 140 134 - 144 mmol/L   Potassium 3.9 3.5 - 5.2 mmol/L   Chloride 105 96 - 106 mmol/L   CO2 23 20 - 29 mmol/L   Calcium 9.2 8.7 - 10.2 mg/dL   Total Protein 6.1 6.0 - 8.5 g/dL   Albumin 4.2 3.8 - 4.9 g/dL   Globulin, Total 1.9 1.5 - 4.5 g/dL   Bilirubin Total 0.3 0.0 - 1.2 mg/dL   Alkaline Phosphatase 77 44 - 121 IU/L   AST 18 0 - 40 IU/L   ALT 14 0 - 32 IU/L  TSH     Status: None   Collection Time: 11/16/22 11:19 AM  Result Value Ref Range   TSH 2.520 0.450 - 4.500 uIU/mL  Hemoglobin A1c     Status: None   Collection Time: 11/16/22 11:19 AM  Result Value Ref Range   Hgb A1c MFr Bld 5.5 4.8 - 5.6 %    Comment:          Prediabetes: 5.7 - 6.4          Diabetes: >6.4          Glycemic control for adults with diabetes: <7.0    Est. average glucose Bld gHb Est-mCnc 111 mg/dL  Vitamin M01     Status: None   Collection Time:  11/16/22 11:19 AM  Result Value Ref Range   Vitamin B-12 655 232 - 1,245 pg/mL  Iron, TIBC and Ferritin Panel     Status: None   Collection Time: 11/16/22 11:19 AM  Result Value Ref Range   Total Iron  Binding Capacity 349 250 - 450 ug/dL   UIBC 213 086 - 578 ug/dL   Iron 77 27 - 469 ug/dL   Iron Saturation 22 15 - 55 %   Ferritin 31 15 - 150 ng/mL       Assessment & Plan:   Problem List Items Addressed This Visit       Active Problems   Severe obesity (BMI >= 40) (HCC)    Starting patient on Wegovy today.   Will adjust as needed based on results.  The patient is asked to make an attempt to improve diet and exercise patterns to aid in medical management of this problem. Addressed importance of increasing and maintaining water intake.        Relevant Medications   Semaglutide-Weight Management 0.5 MG/0.5ML SOAJ (Start on 12/15/2022)   Semaglutide-Weight Management 1 MG/0.5ML SOAJ (Start on 01/13/2023)   Semaglutide-Weight Management 1.7 MG/0.75ML SOAJ (Start on 02/11/2023)   Semaglutide-Weight Management 2.4 MG/0.75ML SOAJ (Start on 03/12/2023)   Other Relevant Orders   CMP14+EGFR (Completed)   CBC with Differential/Platelet   Essential hypertension, benign    Blood pressure well controlled with current medications.  Continue current therapy.  Will reassess at follow up.       Relevant Medications   hydrochlorothiazide (HYDRODIURIL) 12.5 MG tablet   Other Relevant Orders   CMP14+EGFR (Completed)   CBC with Differential/Platelet   Vitamin D deficiency, unspecified - Primary    Checking labs today.  Will continue supplements as needed.       Relevant Orders   VITAMIN D 25 Hydroxy (Vit-D Deficiency, Fractures) (Completed)   CMP14+EGFR (Completed)   CBC with Differential/Platelet   Other Visit Diagnoses     B12 deficiency due to diet       Checking labs today.  Will continue supplements as needed.   Relevant Orders   CMP14+EGFR (Completed)   Vitamin B12 (Completed)   CBC with Differential/Platelet   Prediabetes       Checking labs today Patient counseled on dietary choices and verbalized understanding.   Relevant Orders   CMP14+EGFR (Completed)   Hemoglobin  A1c (Completed)   CBC with Differential/Platelet   Mixed hyperlipidemia       Checking labs today.  Continue current therapy for lipid control. Will modify as needed based on labwork results.   Relevant Medications   hydrochlorothiazide (HYDRODIURIL) 12.5 MG tablet   Other Relevant Orders   Lipid panel (Completed)   CMP14+EGFR (Completed)   CBC with Differential/Platelet   Other fatigue       Relevant Orders   CMP14+EGFR (Completed)   TSH (Completed)   CBC with Differential/Platelet   Iron, TIBC and Ferritin Panel (Completed)       Return in about 1 month (around 12/16/2022) for F/U.   Total time spent: 30 minutes  Miki Kins, FNP  11/16/2022   This document may have been prepared by [facility] Voice Recognition software and as such may include unintentional dictation errors.

## 2022-11-17 LAB — CMP14+EGFR
ALT: 14 IU/L (ref 0–32)
AST: 18 IU/L (ref 0–40)
Albumin: 4.2 g/dL (ref 3.8–4.9)
Alkaline Phosphatase: 77 IU/L (ref 44–121)
BUN/Creatinine Ratio: 21 (ref 9–23)
BUN: 15 mg/dL (ref 6–24)
Bilirubin Total: 0.3 mg/dL (ref 0.0–1.2)
CO2: 23 mmol/L (ref 20–29)
Calcium: 9.2 mg/dL (ref 8.7–10.2)
Chloride: 105 mmol/L (ref 96–106)
Creatinine, Ser: 0.73 mg/dL (ref 0.57–1.00)
Globulin, Total: 1.9 g/dL (ref 1.5–4.5)
Glucose: 119 mg/dL — ABNORMAL HIGH (ref 70–99)
Potassium: 3.9 mmol/L (ref 3.5–5.2)
Sodium: 140 mmol/L (ref 134–144)
Total Protein: 6.1 g/dL (ref 6.0–8.5)
eGFR: 96 mL/min/{1.73_m2} (ref >59)

## 2022-11-17 LAB — LIPID PANEL
Chol/HDL Ratio: 2.2 ratio (ref 0.0–4.4)
Cholesterol, Total: 179 mg/dL (ref 100–199)
HDL: 81 mg/dL (ref >39)
LDL Chol Calc (NIH): 89 mg/dL (ref 0–99)
Triglycerides: 44 mg/dL (ref 0–149)
VLDL Cholesterol Cal: 9 mg/dL (ref 5–40)

## 2022-11-17 LAB — VITAMIN D 25 HYDROXY (VIT D DEFICIENCY, FRACTURES): Vit D, 25-Hydroxy: 42.9 ng/mL (ref 30.0–100.0)

## 2022-11-17 LAB — IRON,TIBC AND FERRITIN PANEL
Ferritin: 31 ng/mL (ref 15–150)
Iron Saturation: 22 % (ref 15–55)
Iron: 77 ug/dL (ref 27–159)
Total Iron Binding Capacity: 349 ug/dL (ref 250–450)
UIBC: 272 ug/dL (ref 131–425)

## 2022-11-17 LAB — TSH: TSH: 2.52 u[IU]/mL (ref 0.450–4.500)

## 2022-11-17 LAB — VITAMIN B12: Vitamin B-12: 655 pg/mL (ref 232–1245)

## 2022-11-17 LAB — HEMOGLOBIN A1C
Est. average glucose Bld gHb Est-mCnc: 111 mg/dL
Hgb A1c MFr Bld: 5.5 % (ref 4.8–5.6)

## 2022-11-19 ENCOUNTER — Other Ambulatory Visit: Payer: Self-pay | Admitting: Family

## 2022-11-19 ENCOUNTER — Other Ambulatory Visit: Payer: Self-pay

## 2022-11-19 MED ORDER — SEMAGLUTIDE-WEIGHT MANAGEMENT 0.25 MG/0.5ML ~~LOC~~ SOAJ
0.2500 mg | SUBCUTANEOUS | 0 refills | Status: AC
Start: 2022-11-19 — End: 2022-12-17
  Filled 2022-11-19 – 2022-12-31 (×12): qty 2, 28d supply, fill #0

## 2022-11-20 ENCOUNTER — Other Ambulatory Visit: Payer: Self-pay

## 2022-11-23 ENCOUNTER — Other Ambulatory Visit: Payer: Self-pay

## 2022-11-25 ENCOUNTER — Encounter: Payer: Self-pay | Admitting: Family

## 2022-11-25 NOTE — Assessment & Plan Note (Signed)
Blood pressure well controlled with current medications.  Continue current therapy.  Will reassess at follow up.  

## 2022-11-25 NOTE — Assessment & Plan Note (Signed)
Starting patient on Wegovy today.   Will adjust as needed based on results.  The patient is asked to make an attempt to improve diet and exercise patterns to aid in medical management of this problem. Addressed importance of increasing and maintaining water intake.

## 2022-11-25 NOTE — Assessment & Plan Note (Signed)
Checking labs today.  Will continue supplements as needed.  

## 2022-11-29 ENCOUNTER — Telehealth: Payer: Self-pay | Admitting: Family

## 2022-11-29 ENCOUNTER — Other Ambulatory Visit: Payer: Self-pay

## 2022-12-03 NOTE — Telephone Encounter (Signed)
Entered in error

## 2022-12-14 ENCOUNTER — Other Ambulatory Visit (HOSPITAL_COMMUNITY): Payer: Self-pay

## 2022-12-21 ENCOUNTER — Encounter: Payer: Self-pay | Admitting: Family

## 2022-12-21 ENCOUNTER — Ambulatory Visit (INDEPENDENT_AMBULATORY_CARE_PROVIDER_SITE_OTHER): Payer: 59 | Admitting: Family

## 2022-12-21 ENCOUNTER — Other Ambulatory Visit: Payer: Self-pay

## 2022-12-21 NOTE — Progress Notes (Unsigned)
Established Patient Office Visit  Subjective:  Patient ID: Julia Valenzuela, female    DOB: 03/01/1966  Age: 57 y.o. MRN: [id number]  Chief Complaint  Patient presents with  . Follow-up    1 month follow up    HPI  No other concerns at this time.   Past Medical History:  Diagnosis Date  . Arthritis of knee, right 12/30/2014  . Arthritis of left knee 04/12/2022  . Chronic pain of both knees 06/04/2020  . Essential hypertension, benign 04/11/2022  . Hyperlipidemia   . Severe obesity (BMI >= 40) (HCC) 06/04/2020  . 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 Vitamin D deficiency, unspecified 04/11/2022    Past Surgical History:  Procedure Laterality Date  . ABDOMINAL HYSTERECTOMY      Social History   Socioeconomic History  . Marital status: Married    Spouse name: Not on file  . Number of children: Not on file  . Years of education: Not on file  . Highest education level: Not on file  Occupational History  . Not on file  Tobacco Use  . Smoking status: Never  . Smokeless tobacco: Never  Vaping Use  . Vaping status: Never Used  Substance and Sexual Activity  . Alcohol use: Never  . Drug use: Never  . Sexual activity: Not Currently  Other Topics Concern  . Not on file  Social History Narrative  . Not on file   Social Determinants of Health   Financial Resource Strain: Not on file  Food Insecurity: Not on file  Transportation Needs: Not on file  Physical Activity: Not on file  Stress: Not on file  Social Connections: Not on file  Intimate Partner Violence: Not on file    Family History  Problem Relation Age of Onset  . Breast cancer Sister 36  . Breast cancer Maternal Aunt        mat great aunt    Allergies  Allergen Reactions  . Oxycodone-Acetaminophen Itching, Rash and Hives    ROS     Objective:   BP 114/84   Pulse 95   Ht 5\' 6"  (1.676 m)   Wt 267 lb 3.2 oz (121.2 kg)   SpO2 94%   BMI 43.13 kg/m   Vitals:    12/21/22 1322  BP: 114/84  Pulse: 95  Height: 5\' 6"  (1.676 m)  Weight: 267 lb 3.2 oz (121.2 kg)  SpO2: 94%  BMI (Calculated): 43.15    Physical Exam   No results found for any visits on 12/21/22.  Recent Results (from the past 2160 hour(s))  Lipid panel     Status: None   Collection Time: 11/16/22 11:19 AM  Result Value Ref Range   Cholesterol, Total 179 100 - 199 mg/dL   Triglycerides 44 0 - 149 mg/dL   HDL 81 >78 mg/dL   VLDL Cholesterol Cal 9 5 - 40 mg/dL   LDL Chol Calc (NIH) 89 0 - 99 mg/dL   Chol/HDL Ratio 2.2 0.0 - 4.4 ratio    Comment:                                   T. Chol/HDL Ratio  Men  Women                               1/2 Avg.Risk  3.4    3.3                                   Avg.Risk  5.0    4.4                                2X Avg.Risk  9.6    7.1                                3X Avg.Risk 23.4   11.0   VITAMIN D 25 Hydroxy (Vit-D Deficiency, Fractures)     Status: None   Collection Time: 11/16/22 11:19 AM  Result Value Ref Range   Vit D, 25-Hydroxy 42.9 30.0 - 100.0 ng/mL    Comment: Vitamin D deficiency has been defined by the Institute of Medicine and an Endocrine Society practice guideline as a level of serum 25-OH vitamin D less than 20 ng/mL (1,2). The Endocrine Society went on to further define vitamin D insufficiency as a level between 21 and 29 ng/mL (2). 1. IOM (Institute of Medicine). 2010. Dietary reference    intakes for calcium and D. [place]: The    [employer]. 2. Holick MF, Binkley [place], Bischoff-Ferrari HA, et al.    Evaluation, treatment, and prevention of vitamin D    deficiency: an Endocrine Society clinical practice    guideline. JCEM. 2011 Jul; 96(7):1911-30.   CMP14+EGFR     Status: Abnormal   Collection Time: 11/16/22 11:19 AM  Result Value Ref Range   Glucose 119 (H) 70 - 99 mg/dL   BUN 15 6 - 24 mg/dL   Creatinine, Ser 1.61 0.57 - 1.00 mg/dL   eGFR 96 >09  UE/AVW/0.98   BUN/Creatinine Ratio 21 9 - 23   Sodium 140 134 - 144 mmol/L   Potassium 3.9 3.5 - 5.2 mmol/L   Chloride 105 96 - 106 mmol/L   CO2 23 20 - 29 mmol/L   Calcium 9.2 8.7 - 10.2 mg/dL   Total Protein 6.1 6.0 - 8.5 g/dL   Albumin 4.2 3.8 - 4.9 g/dL   Globulin, Total 1.9 1.5 - 4.5 g/dL   Bilirubin Total 0.3 0.0 - 1.2 mg/dL   Alkaline Phosphatase 77 44 - 121 IU/L   AST 18 0 - 40 IU/L   ALT 14 0 - 32 IU/L  TSH     Status: None   Collection Time: 11/16/22 11:19 AM  Result Value Ref Range   TSH 2.520 0.450 - 4.500 uIU/mL  Hemoglobin A1c     Status: None   Collection Time: 11/16/22 11:19 AM  Result Value Ref Range   Hgb A1c MFr Bld 5.5 4.8 - 5.6 %    Comment:          Prediabetes: 5.7 - 6.4          Diabetes: >6.4          Glycemic control for adults with diabetes: <7.0    Est. average glucose Bld gHb Est-mCnc 111 mg/dL  Vitamin J19     Status: None   Collection Time:  11/16/22 11:19 AM  Result Value Ref Range   Vitamin B-12 655 232 - 1,245 pg/mL  Iron, TIBC and Ferritin Panel     Status: None   Collection Time: 11/16/22 11:19 AM  Result Value Ref Range   Total Iron Binding Capacity 349 250 - 450 ug/dL   UIBC 161 096 - 045 ug/dL   Iron 77 27 - 409 ug/dL   Iron Saturation 22 15 - 55 %   Ferritin 31 15 - 150 ng/mL       Assessment & Plan:   Problem List Items Addressed This Visit   None   No follow-ups on file.   Total time spent: {AMA time spent:29001} minutes  Miki Kins, FNP  12/21/2022   This document may have been prepared by [facility] Voice Recognition software and as such may include unintentional dictation errors.

## 2022-12-22 ENCOUNTER — Encounter: Payer: Self-pay | Admitting: Family

## 2022-12-22 NOTE — Assessment & Plan Note (Signed)
Pt given samples in office today.  Will follow up in 1 month to evaluate for side effects.

## 2022-12-24 ENCOUNTER — Other Ambulatory Visit: Payer: Self-pay

## 2022-12-25 ENCOUNTER — Other Ambulatory Visit: Payer: Self-pay

## 2022-12-26 ENCOUNTER — Other Ambulatory Visit: Payer: Self-pay

## 2022-12-31 ENCOUNTER — Other Ambulatory Visit: Payer: Self-pay

## 2023-01-25 ENCOUNTER — Encounter: Payer: Self-pay | Admitting: Family

## 2023-01-25 ENCOUNTER — Ambulatory Visit (INDEPENDENT_AMBULATORY_CARE_PROVIDER_SITE_OTHER): Payer: 59 | Admitting: Family

## 2023-01-25 MED ORDER — PHENTERMINE HCL 37.5 MG PO TABS: 37.5000 mg | ORAL_TABLET | Freq: Every day | ORAL | 0 refills | Status: DC

## 2023-01-25 NOTE — Progress Notes (Signed)
Established Patient Office Visit  Subjective:  Patient ID: Julia Valenzuela, female    DOB: 07/22/1965  Age: 58 y.o. MRN: [id number]  Chief Complaint  Patient presents with   Follow-up    1 mo    Patient is here for her 1 month follow up.  She has not been able to get the wegovy to start, as her husband's insurance will only cover it if she goes through a program they sponsor.  She says that now her husband wants to keep her on his insurance despite not wanting her to use it, because it "benefits him" somehow.   She asks if we can try the phentermine again, since she can't get into the program without her husband asking a ton of questions.   Not yet due for labs.    No other concerns at this time.   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   Vitamin D deficiency, unspecified 04/11/2022    Past Surgical History:  Procedure Laterality Date   ABDOMINAL HYSTERECTOMY      Social History   Socioeconomic History   Marital status: Married    Spouse name: Not on file   Number of children: Not on file   Years of education: Not on file   Highest education level: Not on file  Occupational History   Not on file  Tobacco Use   Smoking status: Never   Smokeless tobacco: Never  Vaping Use   Vaping status: Never Used  Substance and Sexual Activity   Alcohol use: Never   Drug use: Never   Sexual activity: Not Currently  Other Topics Concern   Not on file  Social History Narrative   Not on file   Social Determinants of Health   Financial Resource Strain: Not on file  Food Insecurity: Not on file  Transportation Needs: Not on file  Physical Activity: Not on file  Stress: Not on file  Social  Connections: Not on file  Intimate Partner Violence: Not on file    Family History  Problem Relation Age of Onset   Breast cancer Sister 63   Breast cancer Maternal Aunt        mat great aunt    Allergies  Allergen Reactions   Oxycodone-Acetaminophen Itching, Rash and Hives    Review of Systems  Musculoskeletal:  Positive for back pain and joint pain.  All other systems reviewed and are negative.      Objective:   BP (!) 130/90   Pulse 79   Ht 5\' 7"  (1.702 m)   Wt 262 lb (118.8 kg)   SpO2 92%   BMI 41.04 kg/m   Vitals:   01/25/23 1018  BP: (!) 130/90  Pulse: 79  Height: 5\' 7"  (1.702 m)  Weight: 262 lb (118.8 kg)  SpO2: 92%  BMI (Calculated): 41.03    Physical Exam Vitals and nursing note reviewed.  Constitutional:      Appearance: Normal appearance. She is normal weight.  HENT:     Head: Normocephalic.  Eyes:     Extraocular Movements: Extraocular movements intact.     Conjunctiva/sclera: Conjunctivae normal.     Pupils: Pupils are equal, round, and reactive to  light.  Cardiovascular:     Rate and Rhythm: Normal rate.  Pulmonary:     Effort: Pulmonary effort is normal.  Neurological:     General: No focal deficit present.     Mental Status: She is alert and oriented to person, place, and time. Mental status is at baseline.  Psychiatric:        Mood and Affect: Mood normal.        Behavior: Behavior normal.        Thought Content: Thought content normal.        Judgment: Judgment normal.      No results found for any visits on 01/25/23.  Recent Results (from the past 2160 hour(s))  Lipid panel     Status: None   Collection Time: 11/16/22 11:19 AM  Result Value Ref Range   Cholesterol, Total 179 100 - 199 mg/dL   Triglycerides 44 0 - 149 mg/dL   HDL 81 >40 mg/dL   VLDL Cholesterol Cal 9 5 - 40 mg/dL   LDL Chol Calc (NIH) 89 0 - 99 mg/dL   Chol/HDL Ratio 2.2 0.0 - 4.4 ratio    Comment:                                   T. Chol/HDL Ratio                                              Men  Women                               1/2 Avg.Risk  3.4    3.3                                   Avg.Risk  5.0    4.4                                2X Avg.Risk  9.6    7.1                                3X Avg.Risk 23.4   11.0   VITAMIN D 25 Hydroxy (Vit-D Deficiency, Fractures)     Status: None   Collection Time: 11/16/22 11:19 AM  Result Value Ref Range   Vit D, 25-Hydroxy 42.9 30.0 - 100.0 ng/mL    Comment: Vitamin D deficiency has been defined by the Institute of Medicine and an Endocrine Society practice guideline as a level of serum 25-OH vitamin D less than 20 ng/mL (1,2). The Endocrine Society went on to further define vitamin D insufficiency as a level between 21 and 29 ng/mL (2). 1. IOM (Institute of Medicine). 2010. Dietary reference    intakes for calcium and D. [place]: The    [employer]. 2. Holick MF, Binkley , Bischoff-Ferrari HA, et al.    Evaluation, treatment, and prevention of vitamin D    deficiency: an Endocrine Society clinical practice    guideline. JCEM. 2011 Jul; 96(7):1911-30.   CMP14+EGFR     Status:  Abnormal   Collection Time: 11/16/22 11:19 AM  Result Value Ref Range   Glucose 119 (H) 70 - 99 mg/dL   BUN 15 6 - 24 mg/dL   Creatinine, Ser 3.66 0.57 - 1.00 mg/dL   eGFR 96 >44 IH/KVQ/2.59   BUN/Creatinine Ratio 21 9 - 23   Sodium 140 134 - 144 mmol/L   Potassium 3.9 3.5 - 5.2 mmol/L   Chloride 105 96 - 106 mmol/L   CO2 23 20 - 29 mmol/L   Calcium 9.2 8.7 - 10.2 mg/dL   Total Protein 6.1 6.0 - 8.5 g/dL   Albumin 4.2 3.8 - 4.9 g/dL   Globulin, Total 1.9 1.5 - 4.5 g/dL   Bilirubin Total 0.3 0.0 - 1.2 mg/dL   Alkaline Phosphatase 77 44 - 121 IU/L   AST 18 0 - 40 IU/L   ALT 14 0 - 32 IU/L  TSH     Status: None   Collection Time: 11/16/22 11:19 AM  Result Value Ref Range   TSH 2.520 0.450 - 4.500 uIU/mL  Hemoglobin A1c     Status: None   Collection Time: 11/16/22 11:19 AM  Result  Value Ref Range   Hgb A1c MFr Bld 5.5 4.8 - 5.6 %    Comment:          Prediabetes: 5.7 - 6.4          Diabetes: >6.4          Glycemic control for adults with diabetes: <7.0    Est. average glucose Bld gHb Est-mCnc 111 mg/dL  Vitamin D63     Status: None   Collection Time: 11/16/22 11:19 AM  Result Value Ref Range   Vitamin B-12 655 232 - 1,245 pg/mL  Iron, TIBC and Ferritin Panel     Status: None   Collection Time: 11/16/22 11:19 AM  Result Value Ref Range   Total Iron Binding Capacity 349 250 - 450 ug/dL   UIBC 875 643 - 329 ug/dL   Iron 77 27 - 518 ug/dL   Iron Saturation 22 15 - 55 %   Ferritin 31 15 - 150 ng/mL       Assessment & Plan:   Problem List Items Addressed This Visit       Active Problems   Severe obesity (BMI >= 40) (HCC) - Primary    Starting patient on phentermine again.   she has been reminded of the weight loss requirements to continue therapy.  she verbalized understanding and agreement with plan of care.        Relevant Medications   phentermine (ADIPEX-P) 37.5 MG tablet    Return in about 1 month (around 02/24/2023).   Total time spent: 20 minutes  Miki Kins, FNP  01/25/2023   This document may have been prepared by [facility] Voice Recognition software and as such may include unintentional dictation errors.

## 2023-01-26 NOTE — Assessment & Plan Note (Signed)
Starting patient on phentermine again.   she has been reminded of the weight loss requirements to continue therapy.  she verbalized understanding and agreement with plan of care.

## 2023-02-25 ENCOUNTER — Other Ambulatory Visit: Payer: Self-pay | Admitting: Family

## 2023-03-01 ENCOUNTER — Ambulatory Visit: Payer: 59 | Admitting: Family

## 2023-03-05 ENCOUNTER — Ambulatory Visit: Payer: 59 | Admitting: Family

## 2023-03-07 ENCOUNTER — Ambulatory Visit: Payer: 59 | Admitting: Family

## 2023-03-13 ENCOUNTER — Ambulatory Visit: Payer: 59 | Admitting: Family

## 2023-03-13 ENCOUNTER — Encounter: Payer: Self-pay | Admitting: Family

## 2023-03-13 VITALS — BP 112/74 | HR 77 | Ht 66.0 in | Wt 266.6 lb

## 2023-03-13 DIAGNOSIS — E559 Vitamin D deficiency, unspecified: Secondary | ICD-10-CM

## 2023-03-13 DIAGNOSIS — G8929 Other chronic pain: Secondary | ICD-10-CM

## 2023-03-13 DIAGNOSIS — I10 Essential (primary) hypertension: Secondary | ICD-10-CM

## 2023-03-13 DIAGNOSIS — M25561 Pain in right knee: Secondary | ICD-10-CM

## 2023-03-13 DIAGNOSIS — M25562 Pain in left knee: Secondary | ICD-10-CM

## 2023-03-13 MED ORDER — PREDNISONE 20 MG PO TABS: 40.0000 mg | ORAL_TABLET | Freq: Every day | ORAL | 0 refills | Status: DC

## 2023-03-13 MED ORDER — PHENTERMINE HCL 37.5 MG PO TABS: 37.5000 mg | ORAL_TABLET | Freq: Every day | ORAL | 0 refills | Status: DC

## 2023-03-13 NOTE — Assessment & Plan Note (Signed)
 Blood pressure well controlled with current medications.  Continue current therapy.  Will reassess at follow up.

## 2023-03-13 NOTE — Assessment & Plan Note (Signed)
 Patient stable.  Well controlled with current therapy.   Continue current meds.

## 2023-03-13 NOTE — Assessment & Plan Note (Signed)
 Will continue supplements as needed.

## 2023-03-13 NOTE — Progress Notes (Signed)
 Established Patient Office Visit  Subjective:  Patient ID: Julia Valenzuela, female    DOB: 1966-01-07  Age: 58 y.o. MRN: [id number]  Chief Complaint  Patient presents with   Follow-up    1 month follow up    Patient is here today for her 1 month follow up.  She has been feeling fairly well since last appointment.   She does have additional concerns to discuss today.  Labs are not due today. She needs refills.   I have reviewed her active problem list, medication list, allergies, notes from last encounter, lab results for her appointment today.      No other concerns at this time.   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   Vitamin D  deficiency, unspecified 04/11/2022    Past Surgical History:  Procedure Laterality Date   ABDOMINAL HYSTERECTOMY      Social History   Socioeconomic History   Marital status: Married    Spouse name: Not on file   Number of children: Not on file   Years of education: Not on file   Highest education level: Not on file  Occupational History   Not on file  Tobacco Use   Smoking status: Never   Smokeless tobacco: Never  Vaping Use   Vaping status: Never Used  Substance and Sexual Activity   Alcohol use: Never   Drug use: Never   Sexual activity: Not Currently  Other Topics Concern   Not on file  Social History Narrative   Not on file   Social Drivers of Health   Financial Resource Strain: Not on file  Food Insecurity: Not on file  Transportation Needs: Not on file  Physical Activity: Not on file  Stress: Not on file  Social Connections: Not on file  Intimate Partner Violence: Not on file    Family History  Problem Relation Age of Onset    Breast cancer Sister 18   Breast cancer Maternal Aunt        mat great aunt    Allergies  Allergen Reactions   Oxycodone-Acetaminophen Itching, Rash and Hives    Review of Systems  Musculoskeletal:  Positive for back pain and joint pain.  All other systems reviewed and are negative.      Objective:   BP 112/74   Pulse 77   Ht 5' 6 (1.676 m)   Wt 266 lb 9.6 oz (120.9 kg)   SpO2 98%   BMI 43.03 kg/m   Vitals:   03/13/23 0917  BP: 112/74  Pulse: 77  Height: 5' 6 (1.676 m)  Weight: 266 lb 9.6 oz (120.9 kg)  SpO2: 98%  BMI (Calculated): 43.05    Physical Exam Vitals and nursing note reviewed.  Constitutional:      Appearance: Normal appearance. She is normal weight.  HENT:     Head: Normocephalic.  Eyes:     Extraocular Movements: Extraocular movements intact.     Conjunctiva/sclera: Conjunctivae normal.     Pupils: Pupils are equal, round, and reactive to light.  Cardiovascular:     Rate and Rhythm: Normal rate.  Pulmonary:     Effort: Pulmonary effort is normal.  Neurological:  General: No focal deficit present.     Mental Status: She is alert and oriented to person, place, and time. Mental status is at baseline.  Psychiatric:        Mood and Affect: Mood normal.        Behavior: Behavior normal.        Thought Content: Thought content normal.      No results found for any visits on 03/13/23.  No results found for this or any previous visit (from the past 2160 hours).     Assessment & Plan:   Problem List Items Addressed This Visit       Cardiovascular and Mediastinum   Essential hypertension, benign   Blood pressure well controlled with current medications.  Continue current therapy.  Will reassess at follow up.          Other   Chronic pain of both knees   Patient stable.  Well controlled with current therapy.   Continue current meds.        Relevant Medications   predniSONE  (DELTASONE ) 20 MG tablet   Severe obesity (BMI  >= 40) (HCC)   Relevant Medications   phentermine  (ADIPEX-P ) 37.5 MG tablet   Vitamin D  deficiency, unspecified - Primary   Will continue supplements as needed.         Return in about 1 month (around 04/13/2023) for F/U.   Total time spent: 20 minutes  ALAN CHRISTELLA ARRANT, FNP  03/13/2023   This document may have been prepared by [facility] Voice Recognition software and as such may include unintentional dictation errors.

## 2023-03-14 ENCOUNTER — Ambulatory Visit: Payer: 59 | Admitting: Family

## 2023-04-12 ENCOUNTER — Ambulatory Visit (INDEPENDENT_AMBULATORY_CARE_PROVIDER_SITE_OTHER): Payer: 59 | Admitting: Family

## 2023-04-12 ENCOUNTER — Encounter: Payer: Self-pay | Admitting: Family

## 2023-04-12 VITALS — BP 126/72 | HR 80 | Ht 66.0 in | Wt 270.2 lb

## 2023-04-12 DIAGNOSIS — I10 Essential (primary) hypertension: Secondary | ICD-10-CM

## 2023-04-12 DIAGNOSIS — M25562 Pain in left knee: Secondary | ICD-10-CM

## 2023-04-12 DIAGNOSIS — E559 Vitamin D deficiency, unspecified: Secondary | ICD-10-CM | POA: Diagnosis not present

## 2023-04-12 DIAGNOSIS — R7303 Prediabetes: Secondary | ICD-10-CM

## 2023-04-12 DIAGNOSIS — E039 Hypothyroidism, unspecified: Secondary | ICD-10-CM

## 2023-04-12 DIAGNOSIS — E538 Deficiency of other specified B group vitamins: Secondary | ICD-10-CM

## 2023-04-12 DIAGNOSIS — M2550 Pain in unspecified joint: Secondary | ICD-10-CM

## 2023-04-12 DIAGNOSIS — R5383 Other fatigue: Secondary | ICD-10-CM

## 2023-04-12 DIAGNOSIS — M25561 Pain in right knee: Secondary | ICD-10-CM

## 2023-04-12 DIAGNOSIS — M62830 Muscle spasm of back: Secondary | ICD-10-CM

## 2023-04-12 DIAGNOSIS — G8929 Other chronic pain: Secondary | ICD-10-CM

## 2023-04-12 DIAGNOSIS — E782 Mixed hyperlipidemia: Secondary | ICD-10-CM

## 2023-04-12 MED ORDER — METHOCARBAMOL 500 MG PO TABS: 500.0000 mg | ORAL_TABLET | Freq: Four times a day (QID) | ORAL | 0 refills | Status: DC

## 2023-04-12 NOTE — Progress Notes (Signed)
 Established Patient Office Visit  Subjective:  Patient ID: Julia Valenzuela, female    DOB: 1965/10/21  Age: 58 y.o. MRN: [id number]  Chief Complaint  Patient presents with   Follow-up    1 month follow up    Patient is here today for her 1 month follow up.  She has been feeling fairly well since last appointment.   She does have additional concerns to discuss today.  She has been having pains in her lower back that move around but are very sharp.  Someone gave her a muscle relaxer to try, and she says that was the most effective for her back that she's tried.  She asks if we can send her something.  Labs are due today. She needs refills.   I have reviewed her active problem list, medication list, allergies, notes from last encounter, lab results for her appointment today.    No other concerns at this time.   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   Vitamin D  deficiency, unspecified 04/11/2022    Past Surgical History:  Procedure Laterality Date   ABDOMINAL HYSTERECTOMY      Social History   Socioeconomic History   Marital status: Married    Spouse name: Not on file   Number of children: Not on file   Years of education: Not on file   Highest education level: Not on file  Occupational History   Not on file  Tobacco Use   Smoking status: Never   Smokeless tobacco: Never  Vaping Use   Vaping status: Never Used  Substance and Sexual Activity   Alcohol use: Never   Drug use: Never   Sexual activity: Not Currently  Other Topics Concern   Not on file  Social History Narrative   Not on file   Social Drivers of Health   Financial Resource Strain: Not on file  Food Insecurity: Not  on file  Transportation Needs: Not on file  Physical Activity: Not on file  Stress: Not on file  Social Connections: Not on file  Intimate Partner Violence: Not on file    Family History  Problem Relation Age of Onset   Breast cancer Sister 37   Breast cancer Maternal Aunt        mat great aunt    Allergies  Allergen Reactions   Oxycodone-Acetaminophen Itching, Rash and Hives    Review of Systems  Musculoskeletal:  Positive for back pain and joint pain.  All other systems reviewed and are negative.      Objective:   BP 126/72   Pulse 80   Ht 5' 6 (1.676 m)   Wt 270 lb 3.2 oz (122.6 kg)   SpO2 97%   BMI 43.61 kg/m   Vitals:   04/12/23 0943  BP: 126/72  Pulse: 80  Height: 5' 6 (1.676 m)  Weight: 270 lb 3.2 oz (122.6 kg)  SpO2: 97%  BMI (Calculated): 43.63    Physical Exam Vitals and nursing note reviewed.  Constitutional:      Appearance: Normal appearance. She is normal weight.  HENT:     Head: Normocephalic.  Eyes:     Extraocular Movements: Extraocular movements intact.  Conjunctiva/sclera: Conjunctivae normal.     Pupils: Pupils are equal, round, and reactive to light.  Cardiovascular:     Rate and Rhythm: Normal rate.  Pulmonary:     Effort: Pulmonary effort is normal.  Neurological:     General: No focal deficit present.     Mental Status: She is alert and oriented to person, place, and time. Mental status is at baseline.  Psychiatric:        Mood and Affect: Mood normal.        Behavior: Behavior normal.        Thought Content: Thought content normal.        Judgment: Judgment normal.      No results found for any visits on 04/12/23.  No results found for this or any previous visit (from the past 2160 hours).     Assessment & Plan:   Problem List Items Addressed This Visit       Cardiovascular and Mediastinum   Essential hypertension, benign   Blood pressure well controlled with current medications.  Continue current  therapy.  Will reassess at follow up.       Relevant Orders   CMP14+EGFR   CBC with Diff     Other   Chronic pain of both knees   Relevant Medications   methocarbamol  (ROBAXIN ) 500 MG tablet   Other Relevant Orders   Rheumatoid Arthritis Profile   CMP14+EGFR   CBC with Diff   Severe obesity (BMI >= 40) (HCC)   Continue current meds.  Will adjust as needed based on results.  The patient is asked to make an attempt to improve diet and exercise patterns to aid in medical management of this problem. Addressed importance of increasing and maintaining water intake.        Relevant Orders   CMP14+EGFR   CBC with Diff   Vitamin D  deficiency, unspecified - Primary   Checking labs today.  Will continue supplements as needed.       Relevant Orders   VITAMIN D  25 Hydroxy (Vit-D Deficiency, Fractures)   CMP14+EGFR   CBC with Diff   Other Visit Diagnoses       B12 deficiency due to diet       Checking labs today.  Will continue supplements as needed.   Relevant Orders   CMP14+EGFR   Vitamin B12   CBC with Diff     Mixed hyperlipidemia       Checking labs today.  Continue current therapy for lipid control. Will modify as needed based on labwork results.   Relevant Orders   Lipid panel   CMP14+EGFR   CBC with Diff     Other fatigue       Relevant Orders   CMP14+EGFR   CBC with Diff     Hypothyroidism (acquired)       Relevant Orders   CMP14+EGFR   TSH   CBC with Diff     Prediabetes       A1C is in prediabetic ranges. Patient counseled on dietary choices and verbalized understanding. Will reassess at follow up after next lab check.   Relevant Orders   CMP14+EGFR   Hemoglobin A1c   CBC with Diff     Polyarthralgia       Checking rheumatoid panel and ANA again today. Will call with results   Relevant Orders   Rheumatoid Arthritis Profile   CMP14+EGFR   CBC with Diff     Muscle spasm of back  Sending prescription for muscle relaxer for patient. She  will call and let me know if these do not work.       Return in about 3 months (around 07/10/2023).   Total time spent: 20 minutes  ALAN CHRISTELLA ARRANT, FNP  04/12/2023   This document may have been prepared by [facility] Voice Recognition software and as such may include unintentional dictation errors.

## 2023-04-13 ENCOUNTER — Encounter: Payer: Self-pay | Admitting: Family

## 2023-04-13 NOTE — Assessment & Plan Note (Signed)
 Continue current meds.  Will adjust as needed based on results.  The patient is asked to make an attempt to improve diet and exercise patterns to aid in medical management of this problem. Addressed importance of increasing and maintaining water intake.

## 2023-04-13 NOTE — Assessment & Plan Note (Signed)
 Checking labs today.  Will continue supplements as needed.

## 2023-04-13 NOTE — Assessment & Plan Note (Signed)
 Blood pressure well controlled with current medications.  Continue current therapy.  Will reassess at follow up.

## 2023-04-15 LAB — LIPID PANEL
Chol/HDL Ratio: 2.4 {ratio} (ref 0.0–4.4)
Cholesterol, Total: 171 mg/dL (ref 100–199)
HDL: 71 mg/dL (ref >39)
LDL Chol Calc (NIH): 91 mg/dL (ref 0–99)
Triglycerides: 41 mg/dL (ref 0–149)
VLDL Cholesterol Cal: 9 mg/dL (ref 5–40)

## 2023-04-15 LAB — CMP14+EGFR
ALT: 17 [IU]/L (ref 0–32)
AST: 20 [IU]/L (ref 0–40)
Albumin: 4 g/dL (ref 3.8–4.9)
Alkaline Phosphatase: 78 [IU]/L (ref 44–121)
BUN/Creatinine Ratio: 28 — ABNORMAL HIGH (ref 9–23)
BUN: 19 mg/dL (ref 6–24)
Bilirubin Total: <0.2 mg/dL (ref 0.0–1.2)
CO2: 21 mmol/L (ref 20–29)
Calcium: 9.1 mg/dL (ref 8.7–10.2)
Chloride: 107 mmol/L — ABNORMAL HIGH (ref 96–106)
Creatinine, Ser: 0.69 mg/dL (ref 0.57–1.00)
Globulin, Total: 1.9 g/dL (ref 1.5–4.5)
Glucose: 70 mg/dL (ref 70–99)
Potassium: 4.1 mmol/L (ref 3.5–5.2)
Sodium: 143 mmol/L (ref 134–144)
Total Protein: 5.9 g/dL — ABNORMAL LOW (ref 6.0–8.5)
eGFR: 101 mL/min/{1.73_m2} (ref >59)

## 2023-04-15 LAB — CBC WITH DIFFERENTIAL/PLATELET
Basophils Absolute: 0 10*3/uL (ref 0.0–0.2)
Basos: 0 %
EOS (ABSOLUTE): 0.2 10*3/uL (ref 0.0–0.4)
Eos: 3 %
Hematocrit: 41.7 % (ref 34.0–46.6)
Hemoglobin: 13.5 g/dL (ref 11.1–15.9)
Immature Grans (Abs): 0 10*3/uL (ref 0.0–0.1)
Immature Granulocytes: 0 %
Lymphocytes Absolute: 2.1 10*3/uL (ref 0.7–3.1)
Lymphs: 28 %
MCH: 29.8 pg (ref 26.6–33.0)
MCHC: 32.4 g/dL (ref 31.5–35.7)
MCV: 92 fL (ref 79–97)
Monocytes Absolute: 0.4 10*3/uL (ref 0.1–0.9)
Monocytes: 5 %
Neutrophils Absolute: 4.9 10*3/uL (ref 1.4–7.0)
Neutrophils: 64 %
Platelets: 227 10*3/uL (ref 150–450)
RBC: 4.53 x10E6/uL (ref 3.77–5.28)
RDW: 12.2 % (ref 11.7–15.4)
WBC: 7.7 10*3/uL (ref 3.4–10.8)

## 2023-04-15 LAB — RHEUMATOID ARTHRITIS PROFILE
Cyclic Citrullin Peptide Ab: 2 U (ref 0–19)
Rheumatoid fact SerPl-aCnc: <10 [IU]/mL (ref <14.0)

## 2023-04-15 LAB — VITAMIN D 25 HYDROXY (VIT D DEFICIENCY, FRACTURES): Vit D, 25-Hydroxy: 29.6 ng/mL — ABNORMAL LOW (ref 30.0–100.0)

## 2023-04-15 LAB — HEMOGLOBIN A1C
Est. average glucose Bld gHb Est-mCnc: 120 mg/dL
Hgb A1c MFr Bld: 5.8 % — ABNORMAL HIGH (ref 4.8–5.6)

## 2023-04-15 LAB — VITAMIN B12: Vitamin B-12: 519 pg/mL (ref 232–1245)

## 2023-04-15 LAB — TSH: TSH: 2.09 u[IU]/mL (ref 0.450–4.500)

## 2023-05-11 ENCOUNTER — Other Ambulatory Visit: Payer: Self-pay | Admitting: Family

## 2023-06-27 ENCOUNTER — Other Ambulatory Visit: Payer: Self-pay | Admitting: Family

## 2023-06-28 MED ORDER — PREDNISONE 20 MG PO TABS: 40.0000 mg | ORAL_TABLET | Freq: Every day | ORAL | 0 refills | Status: DC

## 2023-07-11 ENCOUNTER — Encounter: Payer: Self-pay | Admitting: Family

## 2023-07-11 ENCOUNTER — Ambulatory Visit (INDEPENDENT_AMBULATORY_CARE_PROVIDER_SITE_OTHER): Admitting: Family

## 2023-07-11 VITALS — BP 108/64 | HR 84 | Ht 66.0 in | Wt 265.8 lb

## 2023-07-11 DIAGNOSIS — M1711 Unilateral primary osteoarthritis, right knee: Secondary | ICD-10-CM | POA: Diagnosis not present

## 2023-07-11 DIAGNOSIS — I10 Essential (primary) hypertension: Secondary | ICD-10-CM

## 2023-07-11 DIAGNOSIS — M1712 Unilateral primary osteoarthritis, left knee: Secondary | ICD-10-CM | POA: Diagnosis not present

## 2023-07-11 NOTE — Progress Notes (Signed)
 Established Patient Office Visit  Subjective:  Patient ID: Julia Valenzuela, female    DOB: Dec 10, 1965  Age: 58 y.o. MRN: [id number]  Chief Complaint  Patient presents with   Follow-up    3 month follow up    Patient is here today for her 3 months follow up.  She has been feeling fairly well since last appointment.   She does have additional concerns to discuss today.  She did go throught the work to get the Zepbound or Wegovy  approved, but she did not ever go to get the medication,  Labs are not due today. She needs refills.   I have reviewed her active problem list, medication list, allergies, notes from last encounter, lab results for her appointment today.      No other concerns at this time.   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   Vitamin D  deficiency, unspecified 04/11/2022    Past Surgical History:  Procedure Laterality Date   ABDOMINAL HYSTERECTOMY      Social History   Socioeconomic History   Marital status: Married    Spouse name: Not on file   Number of children: Not on file   Years of education: Not on file   Highest education level: Not on file  Occupational History   Not on file  Tobacco Use   Smoking status: Never   Smokeless tobacco: Never  Vaping Use   Vaping status: Never Used  Substance and Sexual Activity   Alcohol use: Never   Drug use: Never   Sexual activity: Not Currently  Other Topics Concern   Not on file  Social History Narrative   Not on file   Social Drivers of Health   Financial Resource Strain: Not on file  Food Insecurity: Not on file  Transportation Needs: Not on file  Physical Activity: Not on file  Stress: Not on file  Social  Connections: Not on file  Intimate Partner Violence: Not on file    Family History  Problem Relation Age of Onset   Breast cancer Sister 86   Breast cancer Maternal Aunt        mat great aunt    Allergies  Allergen Reactions   Oxycodone-Acetaminophen Itching, Rash and Hives    Review of Systems  Constitutional:  Positive for malaise/fatigue.  Musculoskeletal:  Positive for joint pain.  All other systems reviewed and are negative.      Objective:   BP 108/64   Pulse 84   Ht 5\' 6"  (1.676 m)   Wt 265 lb 12.8 oz (120.6 kg)   SpO2 95%   BMI 42.90 kg/m   Vitals:   07/11/23 1111  BP: 108/64  Pulse: 84  Height: 5\' 6"  (1.676 m)  Weight: 265 lb 12.8 oz (120.6 kg)  SpO2: 95%  BMI (Calculated): 42.92    Physical Exam Vitals and nursing note reviewed.  Constitutional:      Appearance: Normal appearance. She is normal weight.  HENT:     Head: Normocephalic.  Eyes:     Extraocular Movements: Extraocular movements intact.     Conjunctiva/sclera: Conjunctivae normal.     Pupils: Pupils are equal, round, and reactive to light.  Cardiovascular:     Rate and Rhythm: Normal rate.  Pulmonary:     Effort: Pulmonary effort is normal.  Musculoskeletal:     Right knee: Swelling and crepitus present. Decreased range of motion. Tenderness present.     Left knee: Swelling and crepitus present. Decreased range of motion. Tenderness present.  Neurological:     General: No focal deficit present.     Mental Status: She is alert and oriented to person, place, and time. Mental status is at baseline.  Psychiatric:        Mood and Affect: Mood normal.        Behavior: Behavior normal.        Thought Content: Thought content normal.        Judgment: Judgment normal.      No results found for any visits on 07/11/23.  No results found for this or any previous visit (from the past 2160 hours).     Assessment & Plan:   Problem List Items Addressed This Visit        Cardiovascular and Mediastinum   Essential hypertension, benign - Primary   Blood pressure well controlled with current medications.  Continue current therapy.  Will reassess at follow up.          Musculoskeletal and Integument   Arthritis of knee, right   Encouraged weight loss to help manage her symptoms until we can get her on track for replacements.  Will reassess at follow up.       Arthritis of left knee   Encouraged weight loss to help manage her symptoms until we can get her on track for replacements.  Will reassess at follow up.         Other   Severe obesity (BMI >= 40) (HCC)   Encouraged patient to utilize the Zepbound she has been able to get through her insurance.  She says she will call them when she leaves today.   Reassess at follow up.        Return in about 3 months (around 10/11/2023) for F/U.   Total time spent: 20 minutes  Trenda Frisk, FNP  07/11/2023   This document may have been prepared by [facility] Voice Recognition software and as such may include unintentional dictation errors.

## 2023-07-11 NOTE — Assessment & Plan Note (Signed)
 Encouraged weight loss to help manage her symptoms until we can get her on track for replacements.  Will reassess at follow up.

## 2023-07-11 NOTE — Assessment & Plan Note (Signed)
 Blood pressure well controlled with current medications.  Continue current therapy.  Will reassess at follow up.

## 2023-07-11 NOTE — Assessment & Plan Note (Signed)
 Encouraged patient to utilize the Zepbound she has been able to get through her insurance.  She says she will call them when she leaves today.   Reassess at follow up.

## 2023-07-12 ENCOUNTER — Ambulatory Visit: Payer: 59 | Admitting: Family

## 2023-08-15 ENCOUNTER — Other Ambulatory Visit: Payer: Self-pay | Admitting: Family

## 2023-08-23 ENCOUNTER — Other Ambulatory Visit: Payer: Self-pay | Admitting: Family

## 2023-09-30 ENCOUNTER — Other Ambulatory Visit: Payer: Self-pay | Admitting: Family

## 2023-09-30 DIAGNOSIS — Z1231 Encounter for screening mammogram for malignant neoplasm of breast: Secondary | ICD-10-CM

## 2023-10-10 ENCOUNTER — Encounter: Payer: Self-pay | Admitting: Family

## 2023-10-10 ENCOUNTER — Ambulatory Visit (INDEPENDENT_AMBULATORY_CARE_PROVIDER_SITE_OTHER): Admitting: Family

## 2023-10-10 DIAGNOSIS — M25562 Pain in left knee: Secondary | ICD-10-CM

## 2023-10-10 DIAGNOSIS — E039 Hypothyroidism, unspecified: Secondary | ICD-10-CM

## 2023-10-10 DIAGNOSIS — I10 Essential (primary) hypertension: Secondary | ICD-10-CM | POA: Diagnosis not present

## 2023-10-10 DIAGNOSIS — R5383 Other fatigue: Secondary | ICD-10-CM

## 2023-10-10 DIAGNOSIS — E538 Deficiency of other specified B group vitamins: Secondary | ICD-10-CM | POA: Diagnosis not present

## 2023-10-10 DIAGNOSIS — G8929 Other chronic pain: Secondary | ICD-10-CM

## 2023-10-10 DIAGNOSIS — E782 Mixed hyperlipidemia: Secondary | ICD-10-CM

## 2023-10-10 DIAGNOSIS — R7303 Prediabetes: Secondary | ICD-10-CM

## 2023-10-10 DIAGNOSIS — E559 Vitamin D deficiency, unspecified: Secondary | ICD-10-CM

## 2023-10-10 DIAGNOSIS — M25561 Pain in right knee: Secondary | ICD-10-CM

## 2023-10-10 NOTE — Assessment & Plan Note (Signed)
 Continue current meds.  Will adjust as needed based on results.  The patient is asked to make an attempt to improve diet and exercise patterns to aid in medical management of this problem. Addressed importance of increasing and maintaining water intake.

## 2023-10-10 NOTE — Assessment & Plan Note (Signed)
 Blood pressure well controlled with current medications.  Continue current therapy.  Will reassess at follow up.   - CBC w/Diff - CMP w/eGFR

## 2023-10-10 NOTE — Progress Notes (Signed)
 Established Patient Office Visit  Subjective:  Patient ID: Julia Valenzuela, female    DOB: 03/21/1965  Age: 58 y.o. MRN: [id number]  Chief Complaint  Patient presents with   Follow-up    3 month follow up    Patient is here today for her 3 months follow up.  She has been feeling fairly well since last appointment.   She does not have additional concerns to discuss today.  Labs are due today.  She needs refills.   I have reviewed her active problem list, medication list, allergies, notes from last encounter, lab results for her appointment today.      No other concerns at this time.   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   Vitamin D  deficiency, unspecified 04/11/2022    Past Surgical History:  Procedure Laterality Date   ABDOMINAL HYSTERECTOMY      Social History   Socioeconomic History   Marital status: Married    Spouse name: Not on file   Number of children: Not on file   Years of education: Not on file   Highest education level: Not on file  Occupational History   Not on file  Tobacco Use   Smoking status: Never   Smokeless tobacco: Never  Vaping Use   Vaping status: Never Used  Substance and Sexual Activity   Alcohol use: Never   Drug use: Never   Sexual activity: Not Currently  Other Topics Concern   Not on file  Social History Narrative   Not on file   Social Drivers of Health   Financial Resource Strain: Not on file  Food Insecurity: Not on file  Transportation Needs: Not on file  Physical Activity: Not on file  Stress: Not on file  Social Connections: Not on file  Intimate Partner Violence: Not on file    Family History  Problem Relation Age of Onset    Breast cancer Sister 47   Breast cancer Maternal Aunt        mat great aunt    Allergies  Allergen Reactions   Oxycodone-Acetaminophen Itching, Rash and Hives    Review of Systems  All other systems reviewed and are negative.      Objective:   BP 106/80   Pulse 79   Ht 5' 6 (1.676 m)   Wt 257 lb (116.6 kg)   SpO2 98%   BMI 41.48 kg/m   Vitals:   10/10/23 1004  BP: 106/80  Pulse: 79  Height: 5' 6 (1.676 m)  Weight: 257 lb (116.6 kg)  SpO2: 98%  BMI (Calculated): 41.5    Physical Exam Vitals and nursing note reviewed.  Constitutional:      Appearance: Normal appearance. She is normal weight.  HENT:     Head: Normocephalic.  Eyes:     Extraocular Movements: Extraocular movements intact.     Conjunctiva/sclera: Conjunctivae normal.     Pupils: Pupils are equal, round, and reactive to light.  Cardiovascular:     Rate and Rhythm: Normal rate.  Pulmonary:     Effort: Pulmonary effort is normal.  Neurological:     General: No focal deficit present.     Mental Status: She is  alert and oriented to person, place, and time. Mental status is at baseline.  Psychiatric:        Mood and Affect: Mood normal.        Behavior: Behavior normal.        Thought Content: Thought content normal.      No results found for any visits on 10/10/23.  No results found for this or any previous visit (from the past 2160 hours).     Assessment & Plan Vitamin D  deficiency, unspecified B12 deficiency due to diet Other fatigue Hypothyroidism (acquired) Checking labs today.  Will continue supplements as needed.   - Vitamin D  - Vitamin B12 - TSH  Chronic pain of both knees Patient stable.  Well controlled with current therapy.   Continue current meds.   Essential hypertension, benign Blood pressure well controlled with current medications.  Continue current therapy.  Will reassess at follow up.   - CBC w/Diff - CMP w/eGFR  Severe obesity (BMI >= 40)  (HCC) Continue current meds.  Will adjust as needed based on results.  The patient is asked to make an attempt to improve diet and exercise patterns to aid in medical management of this problem. Addressed importance of increasing and maintaining water intake.   Mixed hyperlipidemia Checking labs today.  Continue current therapy for lipid control. Will modify as needed based on labwork results.   -CMP w/eGFR -Lipid Panel  Prediabetes A1C Continues to be in prediabetic ranges.  Will reassess at follow up after next lab check.  Patient counseled on dietary choices and verbalized understanding.   -CBC w/Diff -CMP w/eGFR -Hemoglobin A1C     Return in about 3 months (around 01/10/2024).   Total time spent: 20 minutes  ALAN CHRISTELLA ARRANT, FNP  10/10/2023   This document may have been prepared by [facility] Voice Recognition software and as such may include unintentional dictation errors.

## 2023-10-10 NOTE — Assessment & Plan Note (Signed)
 Patient stable.  Well controlled with current therapy.   Continue current meds.

## 2023-10-10 NOTE — Assessment & Plan Note (Signed)
 Checking labs today.  Will continue supplements as needed.   - Vitamin D  - Vitamin B12 - TSH

## 2023-10-11 LAB — VITAMIN B12: Vitamin B-12: 562 pg/mL (ref 232–1245)

## 2023-10-11 LAB — CMP14+EGFR
ALT: 10 IU/L (ref 0–32)
AST: 16 IU/L (ref 0–40)
Albumin: 4.5 g/dL (ref 3.8–4.9)
Alkaline Phosphatase: 80 IU/L (ref 44–121)
BUN/Creatinine Ratio: 19 (ref 9–23)
BUN: 16 mg/dL (ref 6–24)
Bilirubin Total: 0.3 mg/dL (ref 0.0–1.2)
CO2: 19 mmol/L — ABNORMAL LOW (ref 20–29)
Calcium: 9.6 mg/dL (ref 8.7–10.2)
Chloride: 105 mmol/L (ref 96–106)
Creatinine, Ser: 0.83 mg/dL (ref 0.57–1.00)
Globulin, Total: 2.3 g/dL (ref 1.5–4.5)
Glucose: 77 mg/dL (ref 70–99)
Potassium: 4.1 mmol/L (ref 3.5–5.2)
Sodium: 141 mmol/L (ref 134–144)
Total Protein: 6.8 g/dL (ref 6.0–8.5)
eGFR: 82 mL/min/1.73 (ref >59)

## 2023-10-11 LAB — LIPID PANEL
Chol/HDL Ratio: 2.6 ratio (ref 0.0–4.4)
Cholesterol, Total: 180 mg/dL (ref 100–199)
HDL: 68 mg/dL (ref >39)
LDL Chol Calc (NIH): 102 mg/dL — ABNORMAL HIGH (ref 0–99)
Triglycerides: 52 mg/dL (ref 0–149)
VLDL Cholesterol Cal: 10 mg/dL (ref 5–40)

## 2023-10-11 LAB — CBC WITH DIFFERENTIAL/PLATELET
Basophils Absolute: 0 x10E3/uL (ref 0.0–0.2)
Basos: 0 %
EOS (ABSOLUTE): 0.2 x10E3/uL (ref 0.0–0.4)
Eos: 3 %
Hematocrit: 44.6 % (ref 34.0–46.6)
Hemoglobin: 14.5 g/dL (ref 11.1–15.9)
Immature Grans (Abs): 0 x10E3/uL (ref 0.0–0.1)
Immature Granulocytes: 0 %
Lymphocytes Absolute: 1.6 x10E3/uL (ref 0.7–3.1)
Lymphs: 22 %
MCH: 30.4 pg (ref 26.6–33.0)
MCHC: 32.5 g/dL (ref 31.5–35.7)
MCV: 94 fL (ref 79–97)
Monocytes Absolute: 0.5 x10E3/uL (ref 0.1–0.9)
Monocytes: 6 %
Neutrophils Absolute: 5 x10E3/uL (ref 1.4–7.0)
Neutrophils: 69 %
Platelets: 208 x10E3/uL (ref 150–450)
RBC: 4.77 x10E6/uL (ref 3.77–5.28)
RDW: 12.3 % (ref 11.7–15.4)
WBC: 7.3 x10E3/uL (ref 3.4–10.8)

## 2023-10-11 LAB — VITAMIN D 25 HYDROXY (VIT D DEFICIENCY, FRACTURES): Vit D, 25-Hydroxy: 60.8 ng/mL (ref 30.0–100.0)

## 2023-10-11 LAB — HEMOGLOBIN A1C
Est. average glucose Bld gHb Est-mCnc: 100 mg/dL
Hgb A1c MFr Bld: 5.1 % (ref 4.8–5.6)

## 2023-10-11 LAB — TSH: TSH: 3.07 u[IU]/mL (ref 0.450–4.500)

## 2023-10-18 ENCOUNTER — Ambulatory Visit
Admission: RE | Admit: 2023-10-18 | Discharge: 2023-10-18 | Disposition: A | Discharge status: Home / Self Care | Source: Ambulatory Visit | Attending: Family | Admitting: Family

## 2023-10-18 DIAGNOSIS — Z1231 Encounter for screening mammogram for malignant neoplasm of breast: Secondary | ICD-10-CM | POA: Insufficient documentation

## 2023-10-21 ENCOUNTER — Other Ambulatory Visit: Payer: Self-pay | Admitting: Family

## 2023-11-09 ENCOUNTER — Other Ambulatory Visit: Payer: Self-pay | Admitting: Family

## 2023-11-23 IMAGING — MG MM DIGITAL SCREENING BILAT W/ TOMO AND CAD
8 series · 8 of 24 positions shown · non-contrast
Comparison: Previous exam(s).

ACR Breast Density Category a: The breast tissue is almost entirely
fatty.

CLINICAL DATA: Screening.

EXAM:
DIGITAL SCREENING BILATERAL MAMMOGRAM WITH TOMOSYNTHESIS AND CAD
TECHNIQUE: Bilateral screening digital craniocaudal and mediolateral oblique
mammograms were obtained. Bilateral screening digital breast
tomosynthesis was performed. The images were evaluated with
computer-aided detection.

[L CC synth-2D]
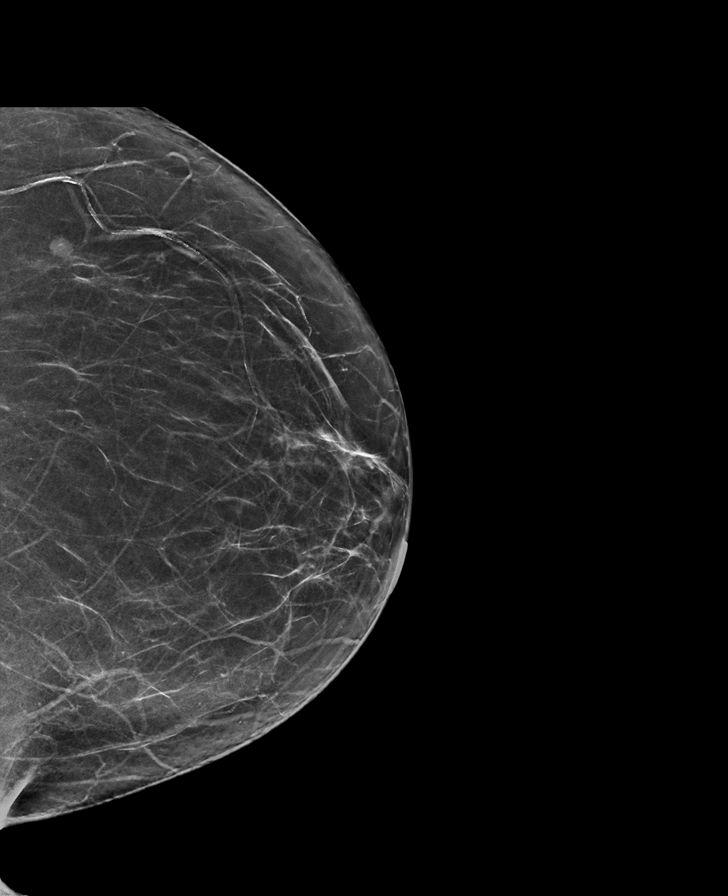

[R CC synth-2D]
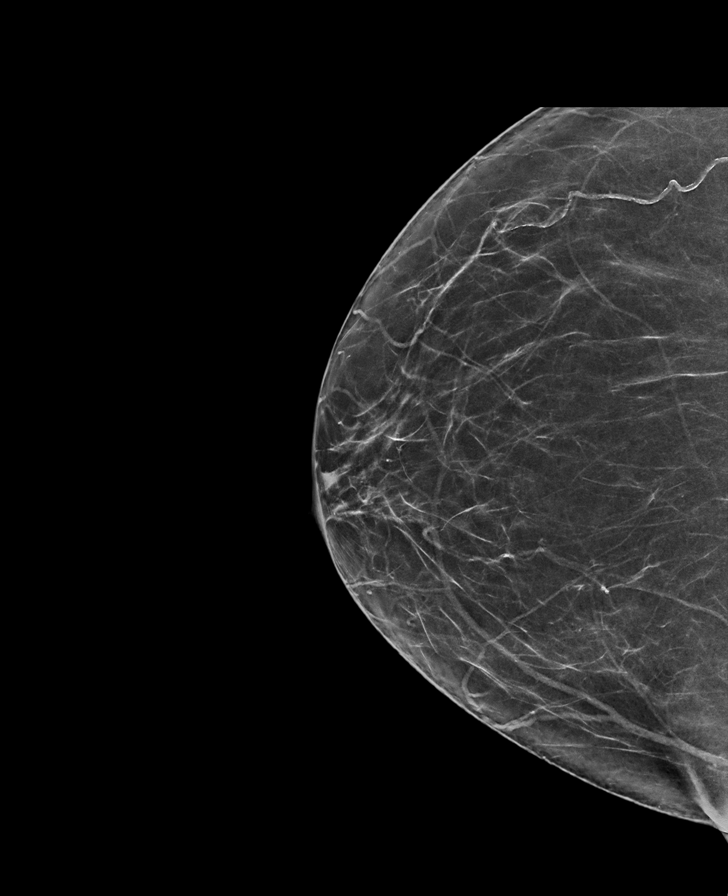

[L MLO synth-2D]
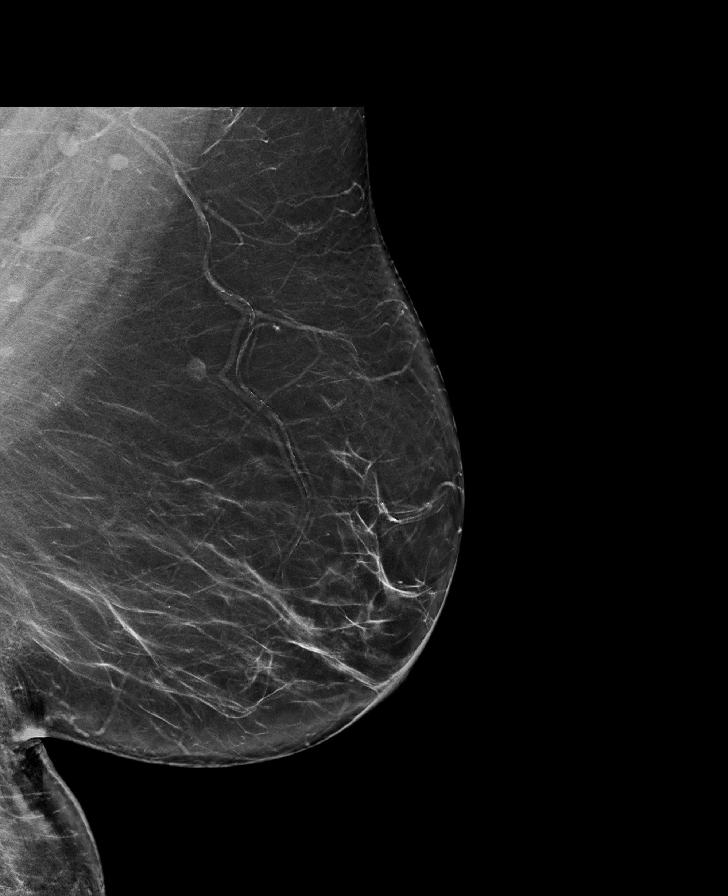

[R MLO synth-2D]
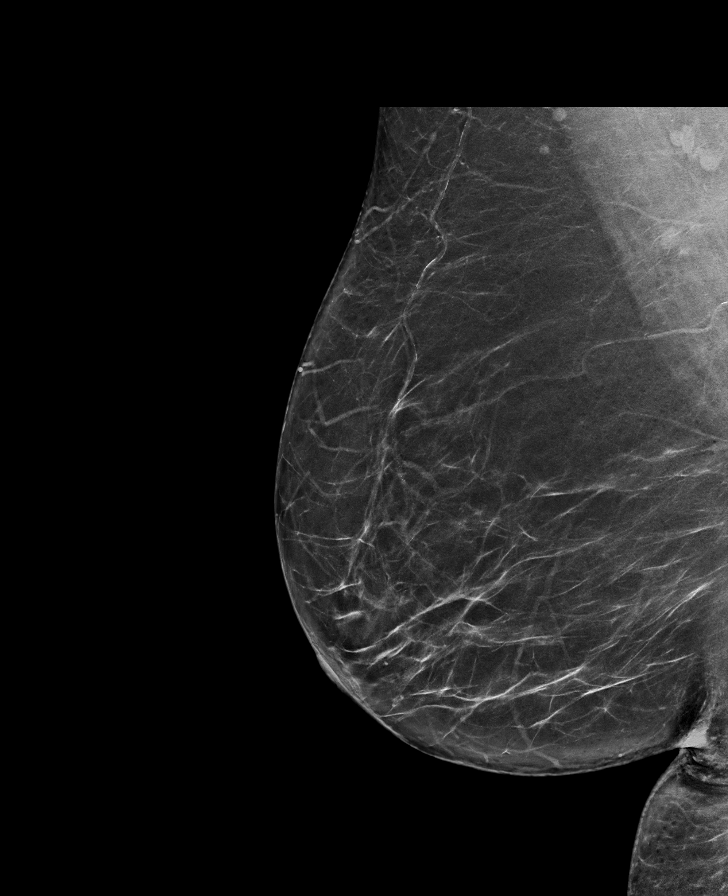

[L CC tomo · tomo slice 35/70.0]
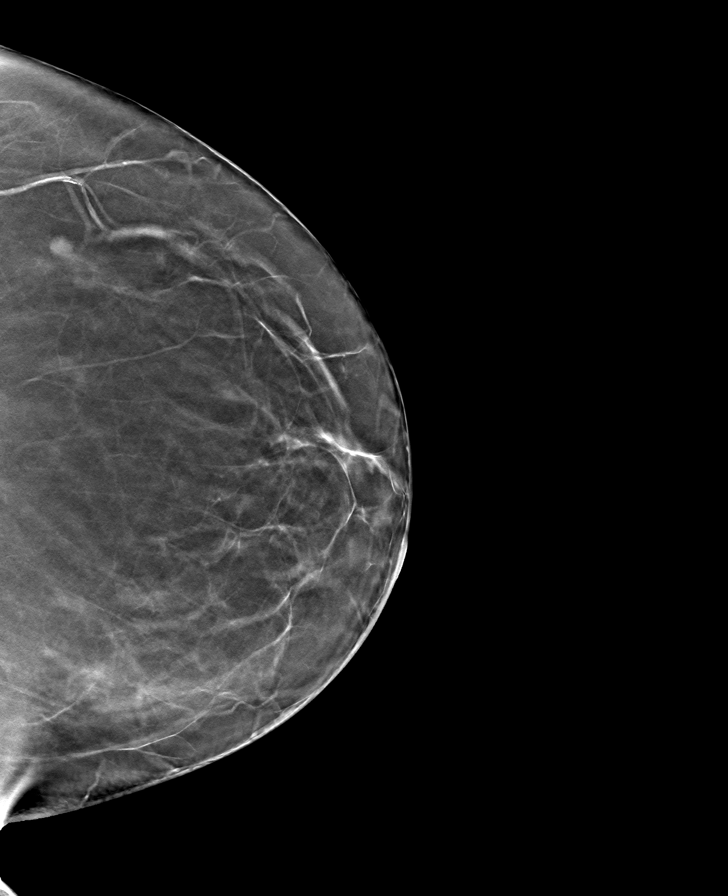

[R CC tomo · tomo slice 36/71.0]
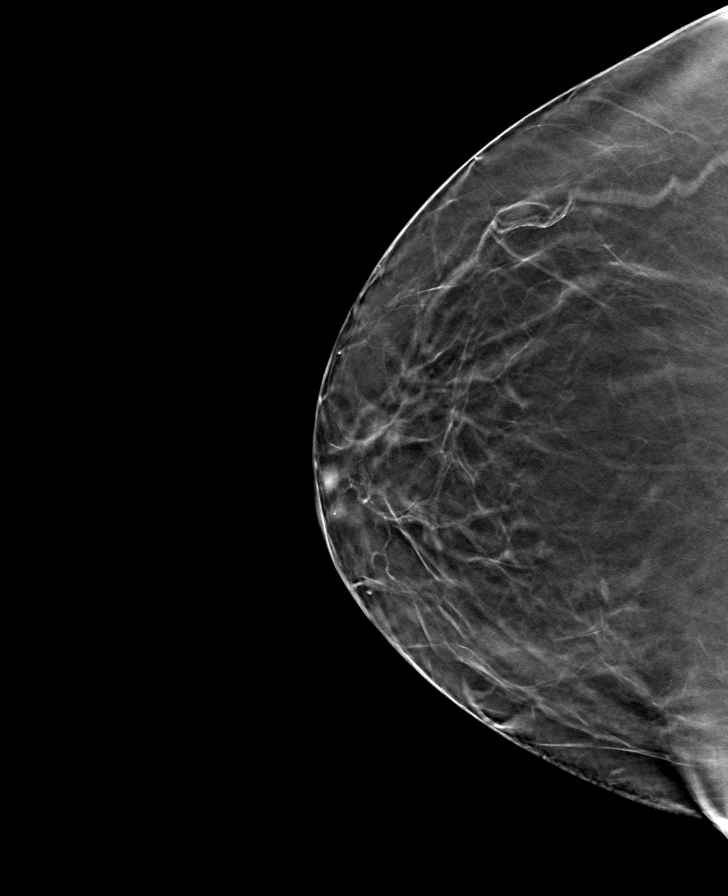

[R MLO tomo · tomo slice 41/82.0]
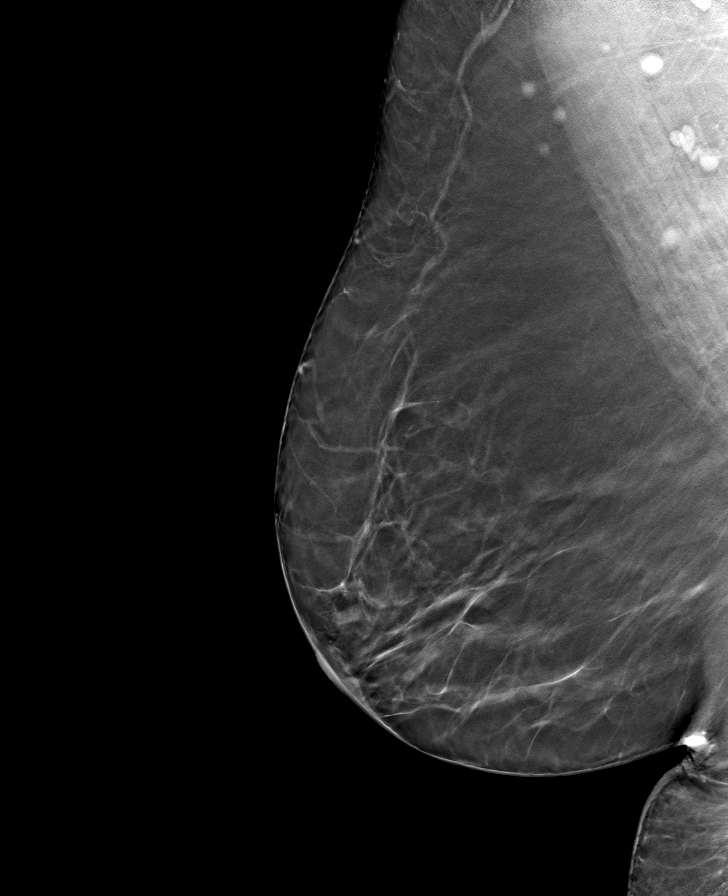

[L MLO tomo · tomo slice 43/86.0]
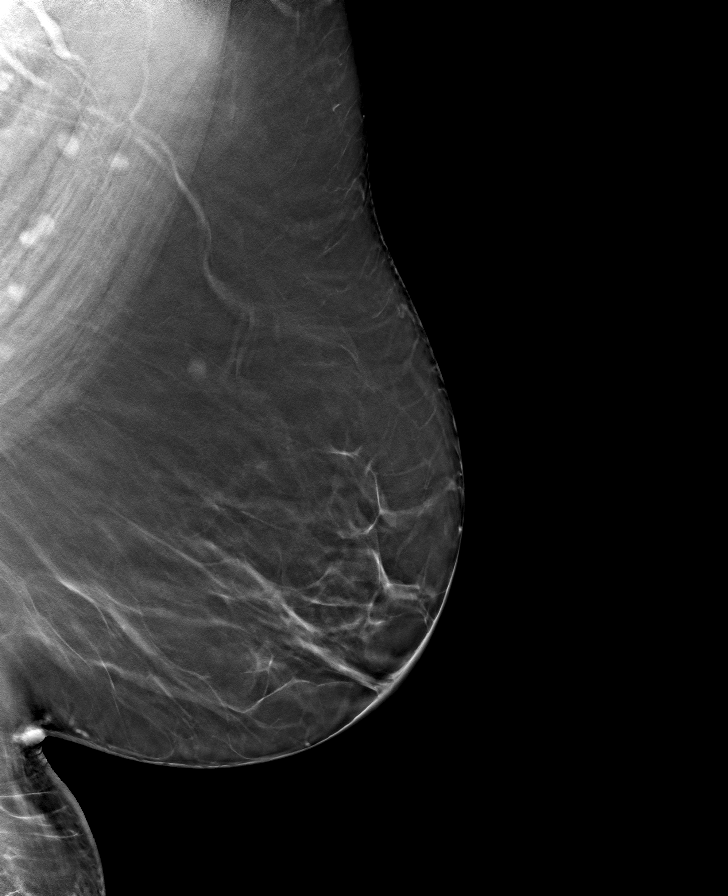

[8 of 24 positions shown; findings below may reference images not displayed]

FINDINGS: There are no findings suspicious for malignancy.
IMPRESSION: No mammographic evidence of malignancy. A result letter of this
screening mammogram will be mailed directly to the patient.

RECOMMENDATION:
Screening mammogram in one year. (Code:0E-3-N98)

BI-RADS CATEGORY  1: Negative.

## 2024-01-02 ENCOUNTER — Ambulatory Visit: Payer: Self-pay

## 2024-01-10 ENCOUNTER — Encounter: Payer: Self-pay | Admitting: Family

## 2024-01-10 ENCOUNTER — Other Ambulatory Visit: Payer: Self-pay | Admitting: Family

## 2024-01-10 ENCOUNTER — Ambulatory Visit (INDEPENDENT_AMBULATORY_CARE_PROVIDER_SITE_OTHER): Admitting: Family

## 2024-01-10 VITALS — BP 118/68 | HR 82 | Ht 66.0 in | Wt 251.4 lb

## 2024-01-10 DIAGNOSIS — E538 Deficiency of other specified B group vitamins: Secondary | ICD-10-CM

## 2024-01-10 DIAGNOSIS — E039 Hypothyroidism, unspecified: Secondary | ICD-10-CM | POA: Diagnosis not present

## 2024-01-10 DIAGNOSIS — G8929 Other chronic pain: Secondary | ICD-10-CM

## 2024-01-10 DIAGNOSIS — E782 Mixed hyperlipidemia: Secondary | ICD-10-CM

## 2024-01-10 DIAGNOSIS — I10 Essential (primary) hypertension: Secondary | ICD-10-CM | POA: Diagnosis not present

## 2024-01-10 DIAGNOSIS — Z23 Encounter for immunization: Secondary | ICD-10-CM | POA: Diagnosis not present

## 2024-01-10 DIAGNOSIS — E559 Vitamin D deficiency, unspecified: Secondary | ICD-10-CM

## 2024-01-10 DIAGNOSIS — Z1211 Encounter for screening for malignant neoplasm of colon: Secondary | ICD-10-CM

## 2024-01-10 DIAGNOSIS — R5383 Other fatigue: Secondary | ICD-10-CM | POA: Insufficient documentation

## 2024-01-10 DIAGNOSIS — J3089 Other allergic rhinitis: Secondary | ICD-10-CM

## 2024-01-10 DIAGNOSIS — M25562 Pain in left knee: Secondary | ICD-10-CM

## 2024-01-10 DIAGNOSIS — M25561 Pain in right knee: Secondary | ICD-10-CM

## 2024-01-10 DIAGNOSIS — R7303 Prediabetes: Secondary | ICD-10-CM

## 2024-01-10 MED ORDER — PREDNISONE 20 MG PO TABS: 40.0000 mg | ORAL_TABLET | Freq: Every day | ORAL | 0 refills | Status: DC

## 2024-01-10 MED ORDER — HYDROCHLOROTHIAZIDE 12.5 MG PO TABS: 12.5000 mg | ORAL_TABLET | Freq: Every day | ORAL | 3 refills | Status: AC

## 2024-01-10 MED ORDER — LEVOCETIRIZINE DIHYDROCHLORIDE 5 MG PO TABS: 5.0000 mg | ORAL_TABLET | Freq: Every day | ORAL | 3 refills | Status: AC

## 2024-01-10 MED ORDER — FLUTICASONE PROPIONATE 50 MCG/ACT NA SUSP: 1.0000 | Freq: Two times a day (BID) | NASAL | 3 refills | Status: DC

## 2024-01-10 NOTE — Assessment & Plan Note (Addendum)
-   Start Prednisone  burst for 5 days.

## 2024-01-10 NOTE — Assessment & Plan Note (Addendum)
-   Check labs today - Supplementation recommended based off lab results and will notify patient at that time

## 2024-01-10 NOTE — Assessment & Plan Note (Addendum)
-   Continue healthy diet and exercise as tolerated. - Continue medications as prescribed. Refills sent. - Check labs today

## 2024-01-10 NOTE — Assessment & Plan Note (Addendum)
 flu shot given

## 2024-01-10 NOTE — Assessment & Plan Note (Addendum)
cologuard ordered.

## 2024-01-10 NOTE — Progress Notes (Signed)
 Established Patient Office Visit  Subjective:  Patient ID: Julia Valenzuela, female    DOB: Jul 14, 1965  Age: 58 y.o. MRN: [id number]  Chief Complaint  Patient presents with   Follow-up    3 month follow up    Patient is here today for her 3 months follow up.  She has been feeling fairly well since last appointment.   She does have additional concerns to discuss today. She reports muscle relaxer and pain medication is not helping with her symptom management anymore for her arthritis. Reports symptoms have gotten worse. She has been told she needs knee replacement surgery but she is unable to get them done at this time. Will prescribe prednisone  burst for short term relief. Recommend patient to FU with her orthopedic specialist when she can to discuss her current options and get anther joint injection.  She reports her pharmacy has not had refills available for her in over 2 months. Sending updated prescriptions with refills on her medications. Labs are due today.   She is due for colon cancer screening. Patient is willing to do cologuard at this time. Will order.  I have reviewed her active problem list, medication list, allergies, family history, social history, health maintenance, notes from last encounter, lab results for her appointment today.    Patient would like her flu shot today.    No other concerns at this time.   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   Vitamin D  deficiency, unspecified 04/11/2022    Past Surgical History:  Procedure Laterality Date   ABDOMINAL HYSTERECTOMY      Social History   Socioeconomic History   Marital status: Married    Spouse name: Not on file   Number of  children: Not on file   Years of education: Not on file   Highest education level: Not on file  Occupational History   Not on file  Tobacco Use   Smoking status: Never   Smokeless tobacco: Never  Vaping Use   Vaping status: Never Used  Substance and Sexual Activity   Alcohol use: Never   Drug use: Never   Sexual activity: Not Currently  Other Topics Concern   Not on file  Social History Narrative   Not on file   Social Drivers of Health   Financial Resource Strain: Not on file  Food Insecurity: Not on file  Transportation Needs: Not on file  Physical Activity: Not on file  Stress: Not on file  Social Connections: Not on file  Intimate Partner Violence: Not on file    Family History  Problem Relation Age of Onset   Breast cancer Sister 74   Breast cancer Maternal Aunt        mat great aunt    Allergies  Allergen Reactions   Oxycodone-Acetaminophen Itching, Rash and Hives    Review of Systems  Constitutional:  Negative for malaise/fatigue.  HENT: Negative.    Eyes:  Negative for blurred vision and pain.  Respiratory:  Negative for cough and shortness of breath.   Cardiovascular:  Negative for chest pain, palpitations, claudication and leg swelling.  Gastrointestinal:  Negative for abdominal pain, blood in stool, constipation, diarrhea, nausea and vomiting.  Genitourinary:  Negative for dysuria, frequency and urgency.  Musculoskeletal:  Positive for joint pain (bilateral arthrits in knees).  Skin: Negative.   Neurological:  Negative for dizziness, tingling, sensory change and headaches.  Endo/Heme/Allergies: Negative.   Psychiatric/Behavioral: Negative.         Objective:   BP 118/68   Pulse 82   Ht 5' 6 (1.676 m)   Wt 251 lb 6.4 oz (114 kg)   SpO2 99%   BMI 40.58 kg/m   Vitals:   01/10/24 0919  BP: 118/68  Pulse: 82  Height: 5' 6 (1.676 m)  Weight: 251 lb 6.4 oz (114 kg)  SpO2: 99%  BMI (Calculated): 40.6    Physical Exam Vitals and  nursing note reviewed.  Constitutional:      Appearance: Normal appearance.  HENT:     Head: Normocephalic.  Eyes:     Extraocular Movements: Extraocular movements intact.     Pupils: Pupils are equal, round, and reactive to light.  Cardiovascular:     Rate and Rhythm: Normal rate and regular rhythm.     Pulses: Normal pulses.     Heart sounds: Normal heart sounds. No murmur heard. Pulmonary:     Effort: Pulmonary effort is normal. No respiratory distress.     Breath sounds: Normal breath sounds.  Abdominal:     General: There is no distension.     Tenderness: There is no abdominal tenderness.  Musculoskeletal:        General: No tenderness. Normal range of motion.     Cervical back: Normal range of motion and neck supple.     Right lower leg: No edema.     Left lower leg: No edema.  Skin:    General: Skin is warm and dry.     Coloration: Skin is not jaundiced.     Findings: No erythema.  Neurological:     General: No focal deficit present.     Mental Status: She is alert and oriented to person, place, and time.  Psychiatric:        Mood and Affect: Mood normal.        Speech: Speech normal.        Behavior: Behavior is cooperative.        Cognition and Memory: Memory is not impaired.      No results found for any visits on 01/10/24.  No results found for this or any previous visit (from the past 2160 hours).     Assessment & Plan Vitamin D  deficiency, unspecified B12 deficiency due to diet Other fatigue Hypothyroidism (acquired) - Check labs today - Supplementation recommended based off lab results and will notify patient at that time   Chronic pain of both knees - Start Prednisone  burst for 5 days.  Essential hypertension, benign Severe obesity (BMI >= 40) (HCC) Mixed hyperlipidemia Prediabetes - Continue healthy diet and exercise as tolerated. - Continue medications as prescribed. Refills sent. - Check labs today   Environmental and seasonal  allergies - refills sent.  Colon cancer screening - cologuard ordered.  Needs flu shot - flu shot given.    Return in about 3 months (around 04/11/2024).   Total time spent: 20 minutes  Oddis DELENA Cain, FNP  01/10/2024   This document may have been prepared by [employer] Voice Recognition software and as such may include unintentional dictation errors.

## 2024-01-10 NOTE — Assessment & Plan Note (Addendum)
 refills sent

## 2024-01-11 ENCOUNTER — Encounter: Payer: Self-pay | Admitting: Family

## 2024-01-11 ENCOUNTER — Ambulatory Visit: Payer: Self-pay

## 2024-01-11 LAB — CBC WITH DIFFERENTIAL/PLATELET
Basophils Absolute: 0 x10E3/uL (ref 0.0–0.2)
Basos: 0 %
EOS (ABSOLUTE): 0.2 x10E3/uL (ref 0.0–0.4)
Eos: 2 %
Hematocrit: 43.5 % (ref 34.0–46.6)
Hemoglobin: 13.9 g/dL (ref 11.1–15.9)
Immature Grans (Abs): 0 x10E3/uL (ref 0.0–0.1)
Immature Granulocytes: 0 %
Lymphocytes Absolute: 1.9 x10E3/uL (ref 0.7–3.1)
Lymphs: 26 %
MCH: 29.8 pg (ref 26.6–33.0)
MCHC: 32 g/dL (ref 31.5–35.7)
MCV: 93 fL (ref 79–97)
Monocytes Absolute: 0.4 x10E3/uL (ref 0.1–0.9)
Monocytes: 5 %
Neutrophils Absolute: 4.9 x10E3/uL (ref 1.4–7.0)
Neutrophils: 67 %
Platelets: 206 x10E3/uL (ref 150–450)
RBC: 4.66 x10E6/uL (ref 3.77–5.28)
RDW: 12.4 % (ref 11.7–15.4)
WBC: 7.4 x10E3/uL (ref 3.4–10.8)

## 2024-01-11 LAB — CMP14+EGFR
ALT: 11 IU/L (ref 0–32)
AST: 17 IU/L (ref 0–40)
Albumin: 4.2 g/dL (ref 3.8–4.9)
Alkaline Phosphatase: 87 IU/L (ref 49–135)
BUN/Creatinine Ratio: 17 (ref 9–23)
BUN: 15 mg/dL (ref 6–24)
Bilirubin Total: 0.2 mg/dL (ref 0.0–1.2)
CO2: 21 mmol/L (ref 20–29)
Calcium: 9.8 mg/dL (ref 8.7–10.2)
Chloride: 105 mmol/L (ref 96–106)
Creatinine, Ser: 0.89 mg/dL (ref 0.57–1.00)
Globulin, Total: 2.4 g/dL (ref 1.5–4.5)
Glucose: 77 mg/dL (ref 70–99)
Potassium: 3.9 mmol/L (ref 3.5–5.2)
Sodium: 143 mmol/L (ref 134–144)
Total Protein: 6.6 g/dL (ref 6.0–8.5)
eGFR: 75 mL/min/1.73 (ref >59)

## 2024-01-11 LAB — LIPID PANEL
Chol/HDL Ratio: 2.6 ratio (ref 0.0–4.4)
Cholesterol, Total: 157 mg/dL (ref 100–199)
HDL: 60 mg/dL (ref >39)
LDL Chol Calc (NIH): 87 mg/dL (ref 0–99)
Triglycerides: 48 mg/dL (ref 0–149)
VLDL Cholesterol Cal: 10 mg/dL (ref 5–40)

## 2024-01-11 LAB — VITAMIN B12: Vitamin B-12: 518 pg/mL (ref 232–1245)

## 2024-01-11 LAB — HEMOGLOBIN A1C
Est. average glucose Bld gHb Est-mCnc: 103 mg/dL
Hgb A1c MFr Bld: 5.2 % (ref 4.8–5.6)

## 2024-01-11 LAB — TSH: TSH: 2.18 u[IU]/mL (ref 0.450–4.500)

## 2024-01-11 LAB — VITAMIN D 25 HYDROXY (VIT D DEFICIENCY, FRACTURES): Vit D, 25-Hydroxy: 57.3 ng/mL (ref 30.0–100.0)

## 2024-01-11 NOTE — Addendum Note (Signed)
 Addended by: ORLEAN PALMA on: 01/11/2024 01:37 PM   Modules accepted: Level of Service

## 2024-01-14 ENCOUNTER — Other Ambulatory Visit: Payer: Self-pay | Admitting: Family

## 2024-01-14 NOTE — Progress Notes (Signed)
 Patient notified

## 2024-01-15 MED ORDER — METHOCARBAMOL 500 MG PO TABS: 500.0000 mg | ORAL_TABLET | Freq: Four times a day (QID) | ORAL | 0 refills | Status: DC

## 2024-01-15 MED ORDER — BUPROPION HCL ER (SR) 150 MG PO TB12: 150.0000 mg | ORAL_TABLET | Freq: Two times a day (BID) | ORAL | 1 refills | Status: DC

## 2024-02-06 ENCOUNTER — Ambulatory Visit: Payer: Self-pay | Admitting: Cardiology

## 2024-02-06 ENCOUNTER — Encounter: Payer: Self-pay | Admitting: Cardiology

## 2024-02-06 ENCOUNTER — Ambulatory Visit (INDEPENDENT_AMBULATORY_CARE_PROVIDER_SITE_OTHER): Admitting: Cardiology

## 2024-02-06 VITALS — BP 116/74 | HR 91 | Ht 66.0 in | Wt 245.2 lb

## 2024-02-06 DIAGNOSIS — R051 Acute cough: Secondary | ICD-10-CM | POA: Insufficient documentation

## 2024-02-06 DIAGNOSIS — I10 Essential (primary) hypertension: Secondary | ICD-10-CM

## 2024-02-06 DIAGNOSIS — R69 Illness, unspecified: Secondary | ICD-10-CM

## 2024-02-06 DIAGNOSIS — J069 Acute upper respiratory infection, unspecified: Secondary | ICD-10-CM | POA: Insufficient documentation

## 2024-02-06 DIAGNOSIS — Z131 Encounter for screening for diabetes mellitus: Secondary | ICD-10-CM

## 2024-02-06 LAB — POCT RAPID INFLUENZA A&B
FLU A: NEGATIVE
FLU B: NEGATIVE

## 2024-02-06 MED ORDER — METHYLPREDNISOLONE 4 MG PO TBPK: ORAL_TABLET | ORAL | 0 refills | Status: AC

## 2024-02-06 MED ORDER — BENZONATATE 200 MG PO CAPS: 200.0000 mg | ORAL_CAPSULE | Freq: Three times a day (TID) | ORAL | 0 refills | Status: AC | PRN

## 2024-02-06 NOTE — Progress Notes (Signed)
 Established Patient Office Visit  Subjective:  Patient ID: Julia Valenzuela, female    DOB: 27-Jan-1966  Age: 58 y.o. MRN: [id number]  Chief Complaint  Patient presents with   Acute Visit    Wet cough, congestion, lack of appetite, and fatigued started Monday. Unable to sleep due to coughing at night.    Patient in office for an acute visit. Patient complaining of cough, congestion, lack of appetite, fatigue. Symptoms started three days ago. Flu swab in office today negative. Cough wore when laying down. Likely PND. Denies headache, fever, chills, runny nose, sore throat, sinus pain. Will send in a medrol dose pack, tessalon pearls. Comfort care at home. Drink plenty of water.  Blood pressure well controlled today.   Cough This is a new problem. The current episode started in the past 7 days. The problem has been unchanged. The problem occurs every few minutes. Associated symptoms include postnasal drip. Pertinent negatives include no chills, fever, headaches, nasal congestion, rhinorrhea, sore throat or shortness of breath. Associated symptoms comments: Anorexia, chest congestion, lack of appetite, fatigue. The symptoms are aggravated by lying down. Treatments tried: Tylenol cold and flu. The treatment provided no relief.    No other concerns at this time.   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   Vitamin D  deficiency, unspecified 04/11/2022    Past Surgical History:  Procedure Laterality Date   ABDOMINAL HYSTERECTOMY      Social History   Socioeconomic History   Marital status: Married    Spouse name: Not on file   Number of children: Not on file   Years of education: Not on file   Highest  education level: Not on file  Occupational History   Not on file  Tobacco Use   Smoking status: Never   Smokeless tobacco: Never  Vaping Use   Vaping status: Never Used  Substance and Sexual Activity   Alcohol use: Never   Drug use: Never   Sexual activity: Not Currently  Other Topics Concern   Not on file  Social History Narrative   Not on file   Social Drivers of Health   Financial Resource Strain: Not on file  Food Insecurity: Not on file  Transportation Needs: Not on file  Physical Activity: Not on file  Stress: Not on file  Social Connections: Not on file  Intimate Partner Violence: Not on file    Family History  Problem Relation Age of Onset   Breast cancer Sister 68   Breast cancer Maternal Aunt        mat great aunt    Allergies  Allergen Reactions   Oxycodone-Acetaminophen Itching, Rash and Hives    Outpatient Medications Prior to Visit  Medication Sig   buPROPion  (WELLBUTRIN  SR) 150 MG 12 hr tablet TAKE 1 TABLET(150 MG) BY MOUTH TWICE DAILY   fluticasone  (FLONASE ) 50 MCG/ACT nasal spray SHAKE LIQUID AND USE 1 SPRAY IN EACH NOSTRIL TWICE DAILY   hydrochlorothiazide  (HYDRODIURIL ) 12.5 MG tablet Take 1 tablet (12.5 mg total) by mouth daily.   levocetirizine (XYZAL ) 5 MG tablet Take 1 tablet (5 mg total) by mouth daily.   methocarbamol  (ROBAXIN ) 500 MG tablet TAKE 1 TABLET(500 MG) BY  MOUTH FOUR TIMES DAILY   nystatin  ointment (MYCOSTATIN ) Apply 1 Application topically 2 (two) times daily.   Vitamin D , Ergocalciferol , (DRISDOL) 1.25 MG (50000 UNIT) CAPS capsule TAKE 1 CAPSULE BY MOUTH 1 TIME A WEEK   [DISCONTINUED] predniSONE  (DELTASONE ) 20 MG tablet Take 2 tablets (40 mg total) by mouth daily with breakfast.   celecoxib  (CELEBREX ) 200 MG capsule Take 1 capsule (200 mg total) by mouth 2 (two) times daily. (Patient not taking: Reported on 02/06/2024)   ZEPBOUND 10 MG/0.5ML Pen Inject 10 mg into the skin once a week.   No facility-administered medications prior  to visit.    Review of Systems  Constitutional:  Negative for chills and fever.  HENT:  Positive for postnasal drip. Negative for congestion, rhinorrhea, sinus pain and sore throat.   Eyes: Negative.   Respiratory:  Positive for cough. Negative for shortness of breath.   Cardiovascular: Negative.   Gastrointestinal:  Negative for constipation and diarrhea.  Genitourinary: Negative.   Musculoskeletal:  Negative for joint pain.  Skin: Negative.   Neurological: Negative.  Negative for dizziness and headaches.  Endo/Heme/Allergies: Negative.   All other systems reviewed and are negative.      Objective:   BP 116/74   Pulse 91   Ht 5' 6 (1.676 m)   Wt 245 lb 3.2 oz (111.2 kg)   SpO2 92%   BMI 39.58 kg/m   Vitals:   02/06/24 1341  BP: 116/74  Pulse: 91  Height: 5' 6 (1.676 m)  Weight: 245 lb 3.2 oz (111.2 kg)  SpO2: 92%  BMI (Calculated): 39.6    Physical Exam Vitals and nursing note reviewed.  Constitutional:      Appearance: Normal appearance. She is normal weight.  HENT:     Head: Normocephalic and atraumatic.     Nose: Nose normal.     Mouth/Throat:     Mouth: Mucous membranes are moist.  Eyes:     Extraocular Movements: Extraocular movements intact.     Conjunctiva/sclera: Conjunctivae normal.     Pupils: Pupils are equal, round, and reactive to light.  Cardiovascular:     Rate and Rhythm: Normal rate and regular rhythm.     Pulses: Normal pulses.     Heart sounds: Normal heart sounds.  Pulmonary:     Effort: Pulmonary effort is normal.     Breath sounds: Normal breath sounds.  Abdominal:     General: Abdomen is flat. Bowel sounds are normal.     Palpations: Abdomen is soft.  Musculoskeletal:        General: Normal range of motion.     Cervical back: Normal range of motion.  Skin:    General: Skin is warm and dry.  Neurological:     General: No focal deficit present.     Mental Status: She is alert and oriented to person, place, and time.   Psychiatric:        Mood and Affect: Mood normal.        Behavior: Behavior normal.        Thought Content: Thought content normal.        Judgment: Judgment normal.      Results for orders placed or performed in visit on 02/06/24  POCT Rapid Influenza A&B  Result Value Ref Range   FLU A Negative    FLU B Negative     Recent Results (from the past 2160 hours)  CMP14+EGFR     Status: None   Collection Time:  01/10/24 10:09 AM  Result Value Ref Range   Glucose 77 70 - 99 mg/dL   BUN 15 6 - 24 mg/dL   Creatinine, Ser 9.10 0.57 - 1.00 mg/dL   eGFR 75 >40 fO/fpw/8.26   BUN/Creatinine Ratio 17 9 - 23   Sodium 143 134 - 144 mmol/L   Potassium 3.9 3.5 - 5.2 mmol/L   Chloride 105 96 - 106 mmol/L   CO2 21 20 - 29 mmol/L   Calcium 9.8 8.7 - 10.2 mg/dL   Total Protein 6.6 6.0 - 8.5 g/dL   Albumin 4.2 3.8 - 4.9 g/dL   Globulin, Total 2.4 1.5 - 4.5 g/dL   Bilirubin Total 0.2 0.0 - 1.2 mg/dL   Alkaline Phosphatase 87 49 - 135 IU/L   AST 17 0 - 40 IU/L   ALT 11 0 - 32 IU/L  Lipid panel     Status: None   Collection Time: 01/10/24 10:09 AM  Result Value Ref Range   Cholesterol, Total 157 100 - 199 mg/dL   Triglycerides 48 0 - 149 mg/dL   HDL 60 >60 mg/dL   VLDL Cholesterol Cal 10 5 - 40 mg/dL   LDL Chol Calc (NIH) 87 0 - 99 mg/dL   Chol/HDL Ratio 2.6 0.0 - 4.4 ratio    Comment:                                   T. Chol/HDL Ratio                                             Men  Women                               1/2 Avg.Risk  3.4    3.3                                   Avg.Risk  5.0    4.4                                2X Avg.Risk  9.6    7.1                                3X Avg.Risk 23.4   11.0   VITAMIN D  25 Hydroxy (Vit-D Deficiency, Fractures)     Status: None   Collection Time: 01/10/24 10:09 AM  Result Value Ref Range   Vit D, 25-Hydroxy 57.3 30.0 - 100.0 ng/mL    Comment: Vitamin D  deficiency has been defined by the Institute of Medicine and an Endocrine Society  practice guideline as a level of serum 25-OH vitamin D  less than 20 ng/mL (1,2). The Endocrine Society went on to further define vitamin D  insufficiency as a level between 21 and 29 ng/mL (2). 1. IOM (Institute of Medicine). 2010. Dietary reference    intakes for calcium and D. [place]: The    [employer]. 2. Holick MF, Binkley Economy, Bischoff-Ferrari HA, et al.    Evaluation, treatment, and prevention of vitamin D   deficiency: an Endocrine Society clinical practice    guideline. JCEM. 2011 Jul; 96(7):1911-30.   Vitamin B12     Status: None   Collection Time: 01/10/24 10:09 AM  Result Value Ref Range   Vitamin B-12 518 232 - 1,245 pg/mL  CBC with Diff     Status: None   Collection Time: 01/10/24 10:09 AM  Result Value Ref Range   WBC 7.4 3.4 - 10.8 x10E3/uL   RBC 4.66 3.77 - 5.28 x10E6/uL   Hemoglobin 13.9 11.1 - 15.9 g/dL   Hematocrit 56.4 65.9 - 46.6 %   MCV 93 79 - 97 fL   MCH 29.8 26.6 - 33.0 pg   MCHC 32.0 31.5 - 35.7 g/dL   RDW 87.5 88.2 - 84.5 %   Platelets 206 150 - 450 x10E3/uL   Neutrophils 67 Not Estab. %   Lymphs 26 Not Estab. %   Monocytes 5 Not Estab. %   Eos 2 Not Estab. %   Basos 0 Not Estab. %   Neutrophils Absolute 4.9 1.4 - 7.0 x10E3/uL   Lymphocytes Absolute 1.9 0.7 - 3.1 x10E3/uL   Monocytes Absolute 0.4 0.1 - 0.9 x10E3/uL   EOS (ABSOLUTE) 0.2 0.0 - 0.4 x10E3/uL   Basophils Absolute 0.0 0.0 - 0.2 x10E3/uL   Immature Granulocytes 0 Not Estab. %   Immature Grans (Abs) 0.0 0.0 - 0.1 x10E3/uL  Hemoglobin A1c     Status: None   Collection Time: 01/10/24 10:09 AM  Result Value Ref Range   Hgb A1c MFr Bld 5.2 4.8 - 5.6 %    Comment:          Prediabetes: 5.7 - 6.4          Diabetes: >6.4          Glycemic control for adults with diabetes: <7.0    Est. average glucose Bld gHb Est-mCnc 103 mg/dL  TSH     Status: None   Collection Time: 01/10/24 10:09 AM  Result Value Ref Range   TSH 2.180 0.450 - 4.500 uIU/mL  POCT Rapid Influenza A&B      Status: Normal   Collection Time: 02/06/24  2:10 PM  Result Value Ref Range   FLU A Negative    FLU B Negative       Assessment & Plan:  Medrol dose pack Tessalon pearls Comfort care Drink plenty of water  Problem List Items Addressed This Visit       Cardiovascular and Mediastinum   Essential hypertension, benign     Respiratory   Viral upper respiratory tract infection - Primary     Other   Acute cough   Other Visit Diagnoses       Sick       Relevant Orders   POCT Rapid Influenza A&B (Completed)       Return if symptoms worsen or fail to improve.   Total time spent: 25 minutes. This time includes review of previous notes and results and patient face to face interaction during today's visit.    Jeoffrey Pollen, NP  02/06/2024   This document may have been prepared by [employer] Voice Recognition software and as such may include unintentional dictation errors.

## 2024-03-12 ENCOUNTER — Telehealth: Payer: Self-pay

## 2024-03-12 NOTE — Telephone Encounter (Signed)
 Pharmacy sent  fax over requesting rx for tessalon  perles and medrol  dose pack, need to call and find out why she needs refills on these medication

## 2024-03-12 NOTE — Telephone Encounter (Signed)
 Pt. States she doesn't need either prescription.

## 2024-03-25 LAB — COLOGUARD: COLOGUARD: NEGATIVE

## 2024-03-27 NOTE — Progress Notes (Signed)
 Patient notified

## 2024-04-13 ENCOUNTER — Ambulatory Visit: Admitting: Family
